# Patient Record
Sex: Female | Born: 1985
Health system: Southern US, Community
[De-identification: ages and names within clinical notes are randomized; demographics above are authoritative.]

## PROBLEM LIST (undated history)

## (undated) DIAGNOSIS — Z23 Encounter for immunization: Secondary | ICD-10-CM

## (undated) DIAGNOSIS — J45909 Unspecified asthma, uncomplicated: Secondary | ICD-10-CM

## (undated) DIAGNOSIS — K219 Gastro-esophageal reflux disease without esophagitis: Secondary | ICD-10-CM

## (undated) DIAGNOSIS — T7840XA Allergy, unspecified, initial encounter: Secondary | ICD-10-CM

## (undated) DIAGNOSIS — S0300XA Dislocation of jaw, unspecified side, initial encounter: Secondary | ICD-10-CM

## (undated) DIAGNOSIS — B009 Herpesviral infection, unspecified: Secondary | ICD-10-CM

## (undated) HISTORY — DX: Encounter for immunization: Z23

## (undated) HISTORY — PX: HERNIA REPAIR: SHX51

## (undated) HISTORY — PX: TONSILLECTOMY: SUR1361

## (undated) HISTORY — PX: MOLE REMOVAL: SHX2046

---
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
## 2007-10-17 ENCOUNTER — Ambulatory Visit: Payer: Self-pay | Admitting: Internal Medicine

## 2007-11-15 ENCOUNTER — Ambulatory Visit: Payer: Self-pay | Admitting: Family Medicine

## 2008-03-27 ENCOUNTER — Emergency Department: Payer: Self-pay | Admitting: Emergency Medicine

## 2009-09-25 ENCOUNTER — Emergency Department: Payer: Self-pay | Admitting: Emergency Medicine

## 2009-12-04 ENCOUNTER — Emergency Department: Payer: Self-pay | Admitting: Emergency Medicine

## 2010-07-04 ENCOUNTER — Ambulatory Visit: Payer: Self-pay | Admitting: Family Medicine

## 2010-11-09 ENCOUNTER — Ambulatory Visit: Payer: Self-pay | Admitting: Internal Medicine

## 2013-02-17 ENCOUNTER — Ambulatory Visit: Payer: Self-pay | Admitting: Internal Medicine

## 2013-12-16 ENCOUNTER — Ambulatory Visit: Payer: Self-pay

## 2013-12-16 LAB — RAPID STREP-A WITH REFLX: Micro Text Report: NEGATIVE

## 2013-12-18 LAB — BETA STREP CULTURE(ARMC)

## 2015-05-10 DIAGNOSIS — J45909 Unspecified asthma, uncomplicated: Secondary | ICD-10-CM | POA: Insufficient documentation

## 2015-05-10 DIAGNOSIS — J309 Allergic rhinitis, unspecified: Secondary | ICD-10-CM | POA: Insufficient documentation

## 2015-05-10 DIAGNOSIS — K219 Gastro-esophageal reflux disease without esophagitis: Secondary | ICD-10-CM | POA: Insufficient documentation

## 2015-05-14 ENCOUNTER — Encounter: Payer: Self-pay | Admitting: Gynecology

## 2015-05-14 ENCOUNTER — Ambulatory Visit
Admission: EM | Admit: 2015-05-14 | Discharge: 2015-05-14 | Disposition: A | Discharge status: Home / Self Care | Payer: BLUE CROSS/BLUE SHIELD | Attending: Internal Medicine | Admitting: Internal Medicine

## 2015-05-14 DIAGNOSIS — M5432 Sciatica, left side: Secondary | ICD-10-CM

## 2015-05-14 HISTORY — DX: Allergy, unspecified, initial encounter: T78.40XA

## 2015-05-14 HISTORY — DX: Herpesviral infection, unspecified: B00.9

## 2015-05-14 HISTORY — DX: Gastro-esophageal reflux disease without esophagitis: K21.9

## 2015-05-14 HISTORY — DX: Unspecified asthma, uncomplicated: J45.909

## 2015-05-14 NOTE — Discharge Instructions (Signed)
Sciatica Sciatica is pain, weakness, numbness, or tingling along the path of the sciatic nerve. The nerve starts in the lower back and runs down the back of each leg. The nerve controls the muscles in the lower leg and in the back of the knee, while also providing sensation to the back of the thigh, lower leg, and the sole of your foot. Sciatica is a symptom of another medical condition. For instance, nerve damage or certain conditions, such as a herniated disk or bone spur on the spine, pinch or put pressure on the sciatic nerve. This causes the pain, weakness, or other sensations normally associated with sciatica. Generally, sciatica only affects one side of the body. CAUSES   Herniated or slipped disc.  Degenerative disk disease.  A pain disorder involving the narrow muscle in the buttocks (piriformis syndrome).  Pelvic injury or fracture.  Pregnancy.  Tumor (rare). SYMPTOMS  Symptoms can vary from mild to very severe. The symptoms usually travel from the low back to the buttocks and down the back of the leg. Symptoms can include:  Mild tingling or dull aches in the lower back, leg, or hip.  Numbness in the back of the calf or sole of the foot.  Burning sensations in the lower back, leg, or hip.  Sharp pains in the lower back, leg, or hip.  Leg weakness.  Severe back pain inhibiting movement. These symptoms may get worse with coughing, sneezing, laughing, or prolonged sitting or standing. Also, being overweight may worsen symptoms. DIAGNOSIS  Your caregiver will perform a physical exam to look for common symptoms of sciatica. He or she may ask you to do certain movements or activities that would trigger sciatic nerve pain. Other tests may be performed to find the cause of the sciatica. These may include:  Blood tests.  X-rays.  Imaging tests, such as an MRI or CT scan. TREATMENT  Treatment is directed at the cause of the sciatic pain. Sometimes, treatment is not necessary  and the pain and discomfort goes away on its own. If treatment is needed, your caregiver may suggest:  Over-the-counter medicines to relieve pain.  Prescription medicines, such as anti-inflammatory medicine, muscle relaxants, or narcotics.  Applying heat or ice to the painful area.  Steroid injections to lessen pain, irritation, and inflammation around the nerve.  Reducing activity during periods of pain.  Exercising and stretching to strengthen your abdomen and improve flexibility of your spine. Your caregiver may suggest losing weight if the extra weight makes the back pain worse.  Physical therapy.  Surgery to eliminate what is pressing or pinching the nerve, such as a bone spur or part of a herniated disk. HOME CARE INSTRUCTIONS   Only take over-the-counter or prescription medicines for pain or discomfort as directed by your caregiver.  Apply ice to the affected area for 20 minutes, 3-4 times a day for the first 48-72 hours. Then try heat in the same way.  Exercise, stretch, or perform your usual activities if these do not aggravate your pain.  Attend physical therapy sessions as directed by your caregiver.  Keep all follow-up appointments as directed by your caregiver.  Do not wear high heels or shoes that do not provide proper support.  Check your mattress to see if it is too soft. A firm mattress may lessen your pain and discomfort. SEEK IMMEDIATE MEDICAL CARE IF:   You lose control of your bowel or bladder (incontinence).  You have increasing weakness in the lower back, pelvis, buttocks,   or legs.  You have redness or swelling of your back.  You have a burning sensation when you urinate.  You have pain that gets worse when you lie down or awakens you at night.  Your pain is worse than you have experienced in the past.  Your pain is lasting longer than 4 weeks.  You are suddenly losing weight without reason. MAKE SURE YOU:  Understand these  instructions.  Will watch your condition.  Will get help right away if you are not doing well or get worse. Document Released: 10/08/2001 Document Revised: 04/14/2012 Document Reviewed: 02/23/2012 ExitCare Patient Information 2015 [employer]. This information is not intended to replace advice given to you by your health care provider. Make sure you discuss any questions you have with your health care provider.  

## 2015-05-14 NOTE — ED Notes (Signed)
Patient c/o left hip pain x 4 days. Pt. Stated deep dull pain per pt. Question bursitis. Patient stated uncomfortable.

## 2015-05-15 ENCOUNTER — Encounter: Payer: Self-pay | Admitting: Physician Assistant

## 2015-05-15 NOTE — ED Provider Notes (Signed)
CSN: [id number]     Arrival date & time 05/14/15  0912 History   First MD Initiated Contact with Patient 05/14/15 [phone]     Chief Complaint  Patient presents with  . Hip Pain   (Consider location/radiation/quality/duration/timing/severity/associated sxs/prior Treatment) HPI 29 yo F with chronic recurrent low back discomfort -she describes as hip but also low left lateral back. Aggravated by rotational activity and repetition, intermittently recurrs. Not described as severe. No neurosensory changes reported. Void/defecate without difficulty.  Past Medical History  Diagnosis Date  . GERD (gastroesophageal reflux disease)   . Allergic   . Asthma   . HSV (herpes simplex virus) infection    Past Surgical History  Procedure Laterality Date  . Tonsillectomy    . Hernia repair    . Mole removal     Family History  Problem Relation Age of Onset  . GER disease Mother   . Irritable bowel syndrome Mother    History  Substance Use Topics  . Smoking status: Never Smoker   . Smokeless tobacco: Not on file  . Alcohol Use: Yes     Comment: occassion   OB History    No data available     Review of Systems Review of 10 systems negative for acute change except as referenced in HPI. Has asthma, GERD and seasonal allergies Allergies  Sulfa antibiotics  Home Medications   Prior to Admission medications   Medication Sig Start Date End Date Taking? Authorizing Provider  fluticasone (FLONASE) 50 MCG/ACT nasal spray Place into both nostrils daily.   Yes Historical Provider, MD  montelukast (SINGULAIR) 10 MG tablet Take 10 mg by mouth at bedtime.   Yes Historical Provider, MD  norgestimate-ethinyl estradiol (ORTHO-CYCLEN,SPRINTEC,PREVIFEM) 0.25-35 MG-MCG tablet Take 1 tablet by mouth daily.   Yes Historical Provider, MD  valACYclovir (VALTREX) 500 MG tablet Take 500 mg by mouth 2 (two) times daily.   Yes Historical Provider, MD   BP 107/70 mmHg  Pulse 69  Temp(Src) 98 F (36.7 C)  (Tympanic)  Ht 5\' 3"  (1.6 m)  Wt 139 lb (63.05 kg)  BMI 24.63 kg/m2  SpO2 98%  LMP 05/09/2015 Physical Exam   Constitutional -alert and oriented,well appearing and in no acute distress Head-atraumatic, normocephalic Eyes- conjunctiva normal, EOMI ,conjugate gaze Nose- no congestion or rhinorrhea Mouth/throat- mucous membranes moist , s/p tonsillectomy Neck- supple CV- regular rate, grossly normal heart sounds,  Resp-no distress, normal respiratory effort,clear to auscultation bilaterally Back- no spinous or para-spinous tenderness to palpation; minimal tenderness left sciatic, negative right. SLR is neg bilaterally. Supine to seated and reverse without assistance/difficulty. DTRs equal bilat, no sensory changes GI- soft,non-tender,no distention GU-  not examined MSK- no tender, normal ROM, all extremities, ambulatory, self-care Neuro- normal speech and language, no gross focal neurological deficit appreciated, no gait instability, Skin-warm,dry ,intact; no rash noted Psych-mood and affect grossly normal; speech and behavior grossly normal ED Course  Procedures (including critical care time) Labs Review Labs Reviewed - No data to display  Imaging Review No results found.  Discussed aggravators and interventions. Might benefit from toning and exercise perhaps a PT referral for recommendations. Her GERD is apparently quite significant and easily aggravated so we move cautiously with NSAIDs and only for acute intervention when needed. Heat /Ice/frozen peas. Body mechanics. Exercises for low back strengthening and sacroilliac pain were reviewed and sent home. Encourage use of athletic rubs, heat patches for comfort-but encourage proactive understanding to avoid aggravating the area.   MDM   1.  Sciatica, left    Plan: 1. Diagnosis reviewed with patient 2. Rx as per orders; risks, benefits, potential side effects reviewed with patient 3. Recommend supportive treatment as noted  above 4. F/u prn if symptoms worsen or don't improve-      Jan Fireman, PA-C 05/15/15 [phone]

## 2016-08-13 ENCOUNTER — Ambulatory Visit
Admission: EM | Admit: 2016-08-13 | Discharge: 2016-08-13 | Disposition: A | Discharge status: Home / Self Care | Payer: BLUE CROSS/BLUE SHIELD | Attending: Family Medicine | Admitting: Family Medicine

## 2016-08-13 DIAGNOSIS — S29012A Strain of muscle and tendon of back wall of thorax, initial encounter: Secondary | ICD-10-CM | POA: Diagnosis not present

## 2016-08-13 LAB — HM PAP SMEAR: HM PAP: NEGATIVE

## 2016-08-13 MED ORDER — HYDROCODONE-ACETAMINOPHEN 5-325 MG PO TABS: 1.0000 | ORAL_TABLET | Freq: Four times a day (QID) | ORAL | 0 refills | Status: DC | PRN

## 2016-08-13 MED ORDER — CYCLOBENZAPRINE HCL 10 MG PO TABS
10.0000 mg | ORAL_TABLET | Freq: Every day | ORAL | 0 refills | Status: DC
Start: 2016-08-13 — End: 2017-08-01

## 2016-08-13 NOTE — ED Triage Notes (Signed)
Patient complains of shoulder pain that started last week after squatting with weight. Patient states that she has been having pain that radiates down and up her arm. Patient states that she has tried ice with little relief.

## 2016-08-13 NOTE — ED Provider Notes (Signed)
[facility]    CSN: [id number] Arrival date & time: 08/13/16  1128     History   Chief Complaint Chief Complaint  Patient presents with  . Shoulder Pain    HPI Erika Moore is a 30 y.o. female.   30 yo female with a c/o right shoulder pain that started last week after squatting with weight. Patient states that she has been having pain that radiates down and up her arm. Patient states that she has tried ice with little relief. Denies any falls, injury, direct trauma, numbness/tingling, rash, fevers, chills.    The history is provided by the patient.    Past Medical History:  Diagnosis Date  . Allergic   . Asthma   . GERD (gastroesophageal reflux disease)   . HSV (herpes simplex virus) infection     There are no active problems to display for this patient.   Past Surgical History:  Procedure Laterality Date  . HERNIA REPAIR    . MOLE REMOVAL    . TONSILLECTOMY      OB History    No data available       Home Medications    Prior to Admission medications   Medication Sig Start Date End Date Taking? Authorizing Provider  montelukast (SINGULAIR) 10 MG tablet Take 10 mg by mouth at bedtime.   Yes Historical Provider, MD  norgestimate-ethinyl estradiol (ORTHO-CYCLEN,SPRINTEC,PREVIFEM) 0.25-35 MG-MCG tablet Take 1 tablet by mouth daily.   Yes Historical Provider, MD  valACYclovir (VALTREX) 500 MG tablet Take 500 mg by mouth 2 (two) times daily.   Yes Historical Provider, MD  cyclobenzaprine (FLEXERIL) 10 MG tablet Take 1 tablet (10 mg total) by mouth at bedtime. 08/13/16   Norval Gable, MD  fluticasone (FLONASE) 50 MCG/ACT nasal spray Place into both nostrils daily.    Historical Provider, MD  HYDROcodone-acetaminophen (NORCO/VICODIN) 5-325 MG tablet Take 1-2 tablets by mouth every 6 (six) hours as needed. 08/13/16   Norval Gable, MD    Family History Family History  Problem Relation Age of Onset  . GER disease Mother   . Irritable bowel  syndrome Mother     Social History Social History  Substance Use Topics  . Smoking status: Never Smoker  . Smokeless tobacco: Never Used  . Alcohol use Yes     Comment: occasional     Allergies   Sulfa antibiotics   Review of Systems Review of Systems   Physical Exam Triage Vital Signs ED Triage Vitals  Enc Vitals Group     BP 08/13/16 1151 107/70     Pulse Rate 08/13/16 1151 64     Resp 08/13/16 1151 16     Temp 08/13/16 1151 98 F (36.7 C)     Temp Source 08/13/16 1151 Oral     SpO2 08/13/16 1151 98 %     Weight 08/13/16 1152 152 lb (68.9 kg)     Height 08/13/16 1152 5\' 3"  (1.6 m)     Head Circumference --      Peak Flow --      Pain Score 08/13/16 1153 4     Pain Loc --      Pain Edu? --      Excl. in [place]? --    No data found.   Updated Vital Signs BP 107/70 (BP Location: Left Arm)   Pulse 64   Temp 98 F (36.7 C) (Oral)   Resp 16   Ht 5\' 3"  (1.6 m)   Wt  152 lb (68.9 kg)   LMP 07/30/2016   SpO2 98%   BMI 26.93 kg/m   Visual Acuity Right Eye Distance:   Left Eye Distance:   Bilateral Distance:    Right Eye Near:   Left Eye Near:    Bilateral Near:     Physical Exam  Constitutional: She appears well-developed and well-nourished. No distress.  Musculoskeletal:       Right shoulder: She exhibits normal range of motion, no tenderness, no bony tenderness, no swelling, no effusion, no crepitus, no deformity, no laceration, no pain, no spasm, normal pulse and normal strength.       Cervical back: She exhibits tenderness (over trapezius muscle) and spasm (over trapezius muscle).       Back:  Skin: She is not diaphoretic.  Nursing note and vitals reviewed.    UC Treatments / Results  Labs (all labs ordered are listed, but only abnormal results are displayed) Labs Reviewed - No data to display  EKG  EKG Interpretation None       Radiology No results found.  Procedures Procedures (including critical care time)  Medications Ordered  in UC Medications - No data to display   Initial Impression / Assessment and Plan / UC Course  I have reviewed the triage vital signs and the nursing notes.  Pertinent labs & imaging results that were available during my care of the patient were reviewed by me and considered in my medical decision making (see chart for details).  Clinical Course      Final Clinical Impressions(s) / UC Diagnoses   Final diagnoses:  Upper back strain, initial encounter    New Prescriptions New Prescriptions   CYCLOBENZAPRINE (FLEXERIL) 10 MG TABLET    Take 1 tablet (10 mg total) by mouth at bedtime.   HYDROCODONE-ACETAMINOPHEN (NORCO/VICODIN) 5-325 MG TABLET    Take 1-2 tablets by mouth every 6 (six) hours as needed.   1. diagnosis reviewed with patient 2. rx as per orders above; reviewed possible side effects, interactions, risks and benefits  3. Recommend supportive treatment with warm compressses; otc analgesics/NSAIDS prn 4. Follow-up prn if symptoms worsen or don't improve   Norval Gable, MD 08/13/16 1246

## 2016-08-16 ENCOUNTER — Telehealth: Payer: Self-pay | Admitting: *Deleted

## 2016-08-16 NOTE — Telephone Encounter (Signed)
Courtesy call back, verified DOB, patient reports no improvement. Advised patient to follow up with PCP or [facility] if symptoms do not resolved or become worse.

## 2017-08-01 ENCOUNTER — Other Ambulatory Visit: Payer: Self-pay

## 2017-08-01 ENCOUNTER — Encounter (INDEPENDENT_AMBULATORY_CARE_PROVIDER_SITE_OTHER): Payer: Self-pay

## 2017-08-01 ENCOUNTER — Ambulatory Visit (INDEPENDENT_AMBULATORY_CARE_PROVIDER_SITE_OTHER): Payer: 59 | Admitting: Gastroenterology

## 2017-08-01 ENCOUNTER — Encounter: Payer: Self-pay | Admitting: Gastroenterology

## 2017-08-01 VITALS — BP 121/77 | HR 64 | Temp 98.4°F | Ht 63.0 in | Wt 140.0 lb

## 2017-08-01 DIAGNOSIS — T7840XA Allergy, unspecified, initial encounter: Secondary | ICD-10-CM | POA: Insufficient documentation

## 2017-08-01 DIAGNOSIS — K219 Gastro-esophageal reflux disease without esophagitis: Secondary | ICD-10-CM

## 2017-08-01 DIAGNOSIS — B009 Herpesviral infection, unspecified: Secondary | ICD-10-CM | POA: Insufficient documentation
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
## 2017-08-18 ENCOUNTER — Encounter: Payer: Self-pay | Admitting: Obstetrics and Gynecology

## 2017-08-18 ENCOUNTER — Ambulatory Visit (INDEPENDENT_AMBULATORY_CARE_PROVIDER_SITE_OTHER): Payer: 59 | Admitting: Obstetrics and Gynecology

## 2017-08-18 DIAGNOSIS — Z124 Encounter for screening for malignant neoplasm of cervix: Secondary | ICD-10-CM | POA: Diagnosis not present

## 2017-08-18 DIAGNOSIS — Z01419 Encounter for gynecological examination (general) (routine) without abnormal findings: Secondary | ICD-10-CM

## 2017-08-18 MED ORDER — TRI-LO-SPRINTEC 0.18/0.215/0.25 MG-25 MCG PO TABS: 1.0000 | ORAL_TABLET | Freq: Every day | ORAL | 3 refills | Status: DC

## 2017-08-18 MED ORDER — VALACYCLOVIR HCL 500 MG PO TABS: 500.0000 mg | ORAL_TABLET | Freq: Two times a day (BID) | ORAL | 6 refills | Status: AC

## 2017-08-18 NOTE — Progress Notes (Signed)
Gynecology Annual Exam  PCP: [facility]  Chief Complaint:  Chief Complaint  Patient presents with  . Gynecologic Exam    History of Present Illness: Patient is a 31 y.o. No obstetric history on file. presents for annual exam. The patient has no complaints today.   LMP: Patient's last menstrual period was 08/01/2017 (exact date). Average Interval: regular, 28 days Duration of flow: 5 days Heavy Menses: no Clots: no Intermenstrual Bleeding: no Postcoital Bleeding: no Dysmenorrhea: no  The patient is sexually active. She currently uses OCP (estrogen/progesterone) for contraception. She denies dyspareunia.  The patient does perform self breast exams.  There is no notable family history of breast or ovarian cancer in her family.  The patient wears seatbelts: yes.   The patient has regular exercise: not asked.    The patient denies current symptoms of depression.    Review of Systems: Review of Systems  Constitutional: Negative for chills and fever.  HENT: Negative for congestion.   Respiratory: Negative for cough and shortness of breath.   Cardiovascular: Negative for chest pain and palpitations.  Gastrointestinal: Negative for abdominal pain, constipation, diarrhea, heartburn, nausea and vomiting.  Genitourinary: Negative for dysuria, frequency and urgency.  Skin: Negative for itching and rash.  Neurological: Negative for dizziness and headaches.  Endo/Heme/Allergies: Negative for polydipsia.  Psychiatric/Behavioral: Negative for depression.    Past Medical History:  Past Medical History:  Diagnosis Date  . Allergic   . Asthma   . GERD (gastroesophageal reflux disease)   . HSV (herpes simplex virus) infection     Past Surgical History:  Past Surgical History:  Procedure Laterality Date  . HERNIA REPAIR    . MOLE REMOVAL    . TONSILLECTOMY      Gynecologic History:  Patient's last menstrual period was 08/01/2017 (exact  date). Contraception: OCP (estrogen/progesterone) Last Pap: Results were: 08/13/2016 no abnormalities   Obstetric History: No obstetric history on file.  Family History:  Family History  Problem Relation Age of Onset  . GER disease Mother   . Irritable bowel syndrome Mother   . Hypertension Mother   . Pancreatic cancer Maternal Grandmother 41  . Ovarian cancer Paternal Grandmother 69  . Leukemia Paternal Grandfather   . Breast cancer Other 31       Genetic test negative    Social History:  Social History   Social History  . Marital status: Single    Spouse name: N/A  . Number of children: N/A  . Years of education: N/A   Occupational History  . Not on file.   Social History Main Topics  . Smoking status: Never Smoker  . Smokeless tobacco: Never Used  . Alcohol use Yes     Comment: occasional  . Drug use: No  . Sexual activity: Yes    Birth control/ protection: Pill   Other Topics Concern  . Not on file   Social History Narrative  . No narrative on file    Allergies:  Allergies  Allergen Reactions  . Sulfa Antibiotics Hives    Medications: Prior to Admission medications   Medication Sig Start Date End Date Taking? Authorizing Provider  fluticasone (FLONASE) 50 MCG/ACT nasal spray Place into both nostrils daily.    [provider]  omeprazole (PRILOSEC) 40 MG capsule  06/03/17   [provider]  pantoprazole (PROTONIX) 40 MG tablet Take by mouth. 07/14/17 07/14/18  [provider]  TRI-LO-SPRINTEC 0.18/0.215/0.25 MG-25 MCG tab  06/09/17  [provider]    Physical Exam Vitals: Blood pressure 110/70, pulse 71, height 5\' 3"  (1.6 m), weight 147 lb (66.7 kg), last menstrual period 08/01/2017.  General: NAD HEENT: normocephalic, anicteric Thyroid: no enlargement, no palpable nodules Pulmonary: No increased work of breathing, CTAB Cardiovascular: RRR, distal pulses 2+ Breast: Breast symmetrical, no tenderness, no palpable  nodules or masses, no skin or nipple retraction present, no nipple discharge.  No axillary or supraclavicular lymphadenopathy. Abdomen: NABS, soft, non-tender, non-distended.  Umbilicus without lesions.  No hepatomegaly, splenomegaly or masses palpable. No evidence of hernia  Genitourinary:  External: Normal external female genitalia.  Normal urethral meatus, normal  Bartholin's and Skene's glands.    Vagina: Normal vaginal mucosa, no evidence of prolapse.    Cervix: Grossly normal in appearance, no bleeding  Uterus: Non-enlarged, mobile, normal contour.  No CMT  Adnexa: ovaries non-enlarged, no adnexal masses  Rectal: deferred  Lymphatic: no evidence of inguinal lymphadenopathy Extremities: no edema, erythema, or tenderness Neurologic: Grossly intact Psychiatric: mood appropriate, affect full  Female chaperone present for pelvic and breast  portions of the physical exam    Assessment: 31 y.o. routine annual exam  Plan: Problem List Items Addressed This Visit    None    Visit Diagnoses    Screening for malignant neoplasm of cervix       Relevant Orders   Pap IG w/ reflex to HPV when [facility]-U   Encounter for gynecological examination without abnormal finding          1) STI screening was offered and declined  2) ASCCP guidelines and rational discussed.  Patient opts for yearly screening interval - previously completed gardasil series  3) Contraception - Education given regarding options for contraception, continue OCP  4) Routine healthcare maintenance including cholesterol, diabetes screening discussed managed by PCP  5) Follow up 1 year for routine annual exam

## 2017-08-19 ENCOUNTER — Encounter: Payer: Self-pay | Admitting: Obstetrics and Gynecology

## 2017-08-19 LAB — PAP IG W/ RFLX HPV ASCU: PAP SMEAR COMMENT: 0

## 2017-08-22 ENCOUNTER — Encounter: Payer: Self-pay | Admitting: Anesthesiology

## 2017-08-28 NOTE — Discharge Instructions (Signed)
General Anesthesia, Adult, Care After °These instructions provide you with information about caring for yourself after your procedure. Your health care provider may also give you more specific instructions. Your treatment has been planned according to current medical practices, but problems sometimes occur. Call your health care provider if you have any problems or questions after your procedure. °What can I expect after the procedure? °After the procedure, it is common to have: °· Vomiting. °· A sore throat. °· Mental slowness. ° °It is common to feel: °· Nauseous. °· Cold or shivery. °· Sleepy. °· Tired. °· Sore or achy, even in parts of your body where you did not have surgery. ° °Follow these instructions at home: °For at least 24 hours after the procedure: °· Do not: °? Participate in activities where you could fall or become injured. °? Drive. °? Use heavy machinery. °? Drink alcohol. °? Take sleeping pills or medicines that cause drowsiness. °? Make important decisions or sign legal documents. °? Take care of children on your own. °· Rest. °Eating and drinking °· If you vomit, drink water, juice, or soup when you can drink without vomiting. °· Drink enough fluid to keep your urine clear or pale yellow. °· Make sure you have little or no nausea before eating solid foods. °· Follow the diet recommended by your health care provider. °General instructions °· Have a responsible adult stay with you until you are awake and alert. °· Return to your normal activities as told by your health care provider. Ask your health care provider what activities are safe for you. °· Take over-the-counter and prescription medicines only as told by your health care provider. °· If you smoke, do not smoke without supervision. °· Keep all follow-up visits as told by your health care provider. This is important. °Contact a health care provider if: °· You continue to have nausea or vomiting at home, and medicines are not helpful. °· You  cannot drink fluids or start eating again. °· You cannot urinate after 8-12 hours. °· You develop a skin rash. °· You have fever. °· You have increasing redness at the site of your procedure. °Get help right away if: °· You have difficulty breathing. °· You have chest pain. °· You have unexpected bleeding. °· You feel that you are having a life-threatening or urgent problem. °This information is not intended to replace advice given to you by your health care provider. Make sure you discuss any questions you have with your health care provider. °Document Released: 01/20/2001 Document Revised: 03/18/2016 Document Reviewed: 09/28/2015 °Elsevier Interactive Patient Education © 2018 [employer] ° °

## 2017-09-03 ENCOUNTER — Ambulatory Visit: Payer: Commercial Managed Care - HMO | Admitting: Anesthesiology

## 2017-09-03 ENCOUNTER — Ambulatory Visit
Admission: RE | Admit: 2017-09-03 | Discharge: 2017-09-03 | Disposition: A | Discharge status: Home / Self Care | Payer: Commercial Managed Care - HMO | Source: Ambulatory Visit | Attending: Gastroenterology | Admitting: Gastroenterology

## 2017-09-03 ENCOUNTER — Encounter
Admission: RE | Disposition: A | Discharge status: Home / Self Care | Payer: Self-pay | Source: Ambulatory Visit | Attending: Gastroenterology

## 2017-09-03 DIAGNOSIS — Z803 Family history of malignant neoplasm of breast: Secondary | ICD-10-CM | POA: Insufficient documentation

## 2017-09-03 DIAGNOSIS — Z8041 Family history of malignant neoplasm of ovary: Secondary | ICD-10-CM | POA: Diagnosis not present

## 2017-09-03 DIAGNOSIS — Z806 Family history of leukemia: Secondary | ICD-10-CM | POA: Insufficient documentation

## 2017-09-03 DIAGNOSIS — J45909 Unspecified asthma, uncomplicated: Secondary | ICD-10-CM | POA: Diagnosis not present

## 2017-09-03 DIAGNOSIS — Z8379 Family history of other diseases of the digestive system: Secondary | ICD-10-CM | POA: Insufficient documentation

## 2017-09-03 DIAGNOSIS — Z800 Family history of malignant neoplasm of digestive organs: Secondary | ICD-10-CM | POA: Insufficient documentation

## 2017-09-03 DIAGNOSIS — Z79899 Other long term (current) drug therapy: Secondary | ICD-10-CM | POA: Insufficient documentation

## 2017-09-03 DIAGNOSIS — Z8249 Family history of ischemic heart disease and other diseases of the circulatory system: Secondary | ICD-10-CM | POA: Diagnosis not present

## 2017-09-03 DIAGNOSIS — Z882 Allergy status to sulfonamides status: Secondary | ICD-10-CM | POA: Insufficient documentation

## 2017-09-03 DIAGNOSIS — K219 Gastro-esophageal reflux disease without esophagitis: Secondary | ICD-10-CM | POA: Insufficient documentation

## 2017-09-03 HISTORY — PX: ESOPHAGOGASTRODUODENOSCOPY (EGD) WITH PROPOFOL: SHX5813

## 2017-09-03 SURGERY — ESOPHAGOGASTRODUODENOSCOPY (EGD) WITH PROPOFOL
Anesthesia: General | Wound class: Clean Contaminated

## 2017-09-03 MED ORDER — LACTATED RINGERS IV SOLN
1000.0000 mL | INTRAVENOUS | Status: DC
  Administered 2017-09-03: 1000 mL via INTRAVENOUS

## 2017-09-03 MED ORDER — SODIUM CHLORIDE 0.9 % IV SOLN: INTRAVENOUS | Status: DC

## 2017-09-03 MED ORDER — GLYCOPYRROLATE 0.2 MG/ML IJ SOLN
INTRAMUSCULAR | Status: DC | PRN
  Administered 2017-09-03: 0.1 mg via INTRAVENOUS

## 2017-09-03 MED ORDER — PROPOFOL 10 MG/ML IV BOLUS
INTRAVENOUS | Status: DC | PRN
  Administered 2017-09-03: 150 mg via INTRAVENOUS
  Administered 2017-09-03: 20 mg via INTRAVENOUS
  Administered 2017-09-03: 30 mg via INTRAVENOUS
  Administered 2017-09-03: 20 mg via INTRAVENOUS

## 2017-09-03 MED ORDER — LIDOCAINE HCL (CARDIAC) 20 MG/ML IV SOLN
INTRAVENOUS | Status: DC | PRN
  Administered 2017-09-03: 50 mg via INTRAVENOUS

## 2017-09-03 SURGICAL SUPPLY — 32 items
BALLN DILATOR 10-12 8 (BALLOONS)
BALLN DILATOR 12-15 8 (BALLOONS)
BALLN DILATOR 15-18 8 (BALLOONS)
BALLN DILATOR CRE 0-12 8 (BALLOONS)
BALLN DILATOR ESOPH 8 10 CRE (MISCELLANEOUS) IMPLANT
BALLOON DILATOR 12-15 8 (BALLOONS) IMPLANT
BALLOON DILATOR 15-18 8 (BALLOONS) IMPLANT
BALLOON DILATOR CRE 0-12 8 (BALLOONS) IMPLANT
BLOCK BITE 60FR ADLT L/F GRN (MISCELLANEOUS) ×3 IMPLANT
CANISTER SUCT 1200ML W/VALVE (MISCELLANEOUS) ×3 IMPLANT
CLIP HMST 235XBRD CATH ROT (MISCELLANEOUS) IMPLANT
CLIP RESOLUTION 360 11X235 (MISCELLANEOUS)
FCP ESCP3.2XJMB 240X2.8X (MISCELLANEOUS) ×1
FORCEPS BIOP RAD 4 LRG CAP 4 (CUTTING FORCEPS) IMPLANT
FORCEPS BIOP RJ4 240 W/NDL (MISCELLANEOUS) ×2
FORCEPS ESCP3.2XJMB 240X2.8X (MISCELLANEOUS) ×1 IMPLANT
GOWN CVR UNV OPN BCK APRN NK (MISCELLANEOUS) ×2 IMPLANT
GOWN ISOL THUMB LOOP REG UNIV (MISCELLANEOUS) ×4
INJECTOR VARIJECT VIN23 (MISCELLANEOUS) IMPLANT
KIT DEFENDO VALVE AND CONN (KITS) IMPLANT
KIT ENDO PROCEDURE OLY (KITS) ×3 IMPLANT
MARKER SPOT ENDO TATTOO 5ML (MISCELLANEOUS) IMPLANT
PAD GROUND ADULT SPLIT (MISCELLANEOUS) IMPLANT
RETRIEVER NET PLAT FOOD (MISCELLANEOUS) IMPLANT
SNARE SHORT THROW 13M SML OVAL (MISCELLANEOUS) IMPLANT
SNARE SHORT THROW 30M LRG OVAL (MISCELLANEOUS) IMPLANT
SPOT EX ENDOSCOPIC TATTOO (MISCELLANEOUS)
SYR INFLATION 60ML (SYRINGE) IMPLANT
TRAP ETRAP POLY (MISCELLANEOUS) IMPLANT
VARIJECT INJECTOR VIN23 (MISCELLANEOUS)
WATER STERILE IRR 250ML POUR (IV SOLUTION) ×3 IMPLANT
WIRE CRE 18-20MM 8CM F G (MISCELLANEOUS) IMPLANT

## 2017-09-03 NOTE — Transfer of Care (Signed)
Immediate Anesthesia Transfer of Care Note  Patient: Erika Moore  Procedure(s) Performed: ESOPHAGOGASTRODUODENOSCOPY (EGD) WITH PROPOFOL (N/A )  Patient Location: PACU  Anesthesia Type: General  Level of Consciousness: awake, alert  and patient cooperative  Airway and Oxygen Therapy: Patient Spontanous Breathing and Patient connected to supplemental oxygen  Post-op Assessment: Post-op Vital signs reviewed, Patient's Cardiovascular Status Stable, Respiratory Function Stable, Patent Airway and No signs of Nausea or vomiting  Post-op Vital Signs: Reviewed and stable  Complications: No apparent anesthesia complications

## 2017-09-03 NOTE — Anesthesia Procedure Notes (Signed)
Date/Time: 09/03/2017 8:36 AM Performed by: Cameron Ali, CRNA Pre-anesthesia Checklist: Patient identified, Emergency Drugs available, Suction available, Timeout performed and Patient being monitored Patient Re-evaluated:Patient Re-evaluated prior to induction Oxygen Delivery Method: Nasal cannula Placement Confirmation: positive ETCO2

## 2017-09-03 NOTE — Op Note (Signed)
[facility] Patient Name: Erika Moore Procedure Date: 09/03/2017 8:28 AM MRN: [id number] Account #: [id number] Date of Birth: Feb 28, 1986 Admit Type: Outpatient Age: 31 Room: [facility] OR ROOM 01 Gender: Female Note Status: Finalized Procedure:            Upper GI endoscopy Indications:          Heartburn, Suspected reflux esophagitis, Suspected                        gastro-esophageal reflux disease Providers:            Lin Landsman MD, MD Referring MD:         Kerin Perna MD, MD (Referring MD) Medicines:            Monitored Anesthesia Care Complications:        No immediate complications. Estimated blood loss: None. Procedure:            Pre-Anesthesia Assessment:                       - Prior to the procedure, a History and Physical was                        performed, and patient medications and allergies were                        reviewed. The patient is competent. The risks and                        benefits of the procedure and the sedation options and                        risks were discussed with the patient. All questions                        were answered and informed consent was obtained.                        Patient identification and proposed procedure were                        verified by the physician, the nurse, the                        anesthesiologist, the anesthetist and the technician in                        the pre-procedure area in the procedure room. Mental                        Status Examination: alert and oriented. Airway                        Examination: normal oropharyngeal airway and neck                        mobility. Respiratory Examination: clear to                        auscultation. CV Examination: normal. Prophylactic  Antibiotics: The patient does not require prophylactic                        antibiotics. Prior Anticoagulants: The patient has         taken no previous anticoagulant or antiplatelet agents.                        ASA Grade Assessment: II - A patient with mild systemic                        disease. After reviewing the risks and benefits, the                        patient was deemed in satisfactory condition to undergo                        the procedure. The anesthesia plan was to use monitored                        anesthesia care (MAC). Immediately prior to                        administration of medications, the patient was                        re-assessed for adequacy to receive sedatives. The                        heart rate, respiratory rate, oxygen saturations, blood                        pressure, adequacy of pulmonary ventilation, and                        response to care were monitored throughout the                        procedure. The physical status of the patient was                        re-assessed after the procedure.                       After obtaining informed consent, the endoscope was                        passed under direct vision. Throughout the procedure,                        the patient's blood pressure, pulse, and oxygen                        saturations were monitored continuously. The Olympus                        GIF H180J Endoscope (S#: [id number]) was introduced                        through the mouth, and advanced to the second part of  duodenum. The upper GI endoscopy was accomplished                        without difficulty. The patient tolerated the procedure                        well. Findings:      The duodenal bulb and second portion of the duodenum were normal.      The entire examined stomach was normal. Reflux of bile noticed into       stomach      The cardia and gastric fundus were normal on retroflexion.      Esophagogastric landmarks were identified: the Z-line was found at 36 cm       from the incisors.      The Z-line was  irregular and was found 36 cm from the incisors.      The examined esophagus was normal. Biopsies were obtained from the       proximal and distal esophagus with cold forceps for histology of       suspected eosinophilic esophagitis. Impression:           - Normal duodenal bulb and second portion of the                        duodenum.                       - Normal stomach. Bile reflux present                       - Esophagogastric landmarks identified.                       - Z-line irregular, 36 cm from the incisors.                       - Normal esophagus. Biopsied. Recommendation:       - Discharge patient to home.                       - Resume previous diet.                       - Continue present medications.                       - Await pathology results.                       - Return to my office as previously scheduled.                       - Use Nexium (esomeprazole) 40 mg PO daily. Procedure Code(s):    --- Professional ---                       [phone], Esophagogastroduodenoscopy, flexible, transoral;                        with biopsy, single or multiple Diagnosis Code(s):    --- Professional ---                       K22.8, Other specified diseases of esophagus  R12, Heartburn CPT copyright 2016 American Medical Association. All rights reserved. The codes documented in this report are preliminary and upon coder review may  be revised to meet current compliance requirements. Dr. Ulyess Mort Lin Landsman MD, MD 09/03/2017 8:51:54 AM This report has been signed electronically. Number of Addenda: 0 Note Initiated On: 09/03/2017 8:28 AM      [facility]

## 2017-09-03 NOTE — Anesthesia Postprocedure Evaluation (Signed)
Anesthesia Post Note  Patient: Erika Moore  Procedure(s) Performed: ESOPHAGOGASTRODUODENOSCOPY (EGD) WITH PROPOFOL (N/A )  Patient location during evaluation: PACU Anesthesia Type: General Level of consciousness: awake and alert Pain management: pain level controlled Vital Signs Assessment: post-procedure vital signs reviewed and stable Respiratory status: spontaneous breathing, nonlabored ventilation, respiratory function stable and patient connected to nasal cannula oxygen Cardiovascular status: blood pressure returned to baseline and stable Postop Assessment: no apparent nausea or vomiting Anesthetic complications: no    Alisa Graff

## 2017-09-03 NOTE — Anesthesia Preprocedure Evaluation (Signed)
Anesthesia Evaluation  Patient identified by MRN, date of birth, ID band Patient awake    Reviewed: Allergy & Precautions, H&P , NPO status , Patient's Chart, lab work & pertinent test results, reviewed documented beta blocker date and time   Airway Mallampati: II  TM Distance: >3 FB Neck ROM: full    Dental no notable dental hx.    Pulmonary asthma ,    Pulmonary exam normal breath sounds clear to auscultation       Cardiovascular Exercise Tolerance: Good negative cardio ROS   Rhythm:regular Rate:Normal     Neuro/Psych negative neurological ROS  negative psych ROS   GI/Hepatic Neg liver ROS, GERD  ,  Endo/Other  negative endocrine ROS  Renal/GU negative Renal ROS  negative genitourinary   Musculoskeletal   Abdominal   Peds  Hematology negative hematology ROS (+)   Anesthesia Other Findings   Reproductive/Obstetrics negative OB ROS                             Anesthesia Physical Anesthesia Plan  ASA: II  Anesthesia Plan: General   Post-op Pain Management:    Induction:   PONV Risk Score and Plan: 3  Airway Management Planned:   Additional Equipment:   Intra-op Plan:   Post-operative Plan:   Informed Consent: I have reviewed the patients History and Physical, chart, labs and discussed the procedure including the risks, benefits and alternatives for the proposed anesthesia with the patient or authorized representative who has indicated his/her understanding and acceptance.   Dental Advisory Given  Plan Discussed with: CRNA  Anesthesia Plan Comments:         Anesthesia Quick Evaluation

## 2017-09-03 NOTE — H&P (Signed)
Cephas Darby, MD [address]  [place]  [place], [place] [address]  Main: [phone]  Fax: [phone] Pager: [phone]  Primary Care Physician:  [facility] Primary Gastroenterologist:  Dr. Cephas Darby  Pre-Procedure History & Physical: HPI:  Erika Moore is a 31 y.o. female is here for an endoscopy.   Past Medical History:  Diagnosis Date  . Allergic   . Asthma   . GERD (gastroesophageal reflux disease)   . HSV (herpes simplex virus) infection     Past Surgical History:  Procedure Laterality Date  . HERNIA REPAIR    . MOLE REMOVAL    . TONSILLECTOMY      Prior to Admission medications   Medication Sig Start Date End Date Taking? Authorizing Provider  esomeprazole (NEXIUM) 20 MG capsule Take 20 mg by mouth daily at 12 noon.   Yes [provider]  fluticasone (FLONASE) 50 MCG/ACT nasal spray Place into both nostrils daily.   Yes [provider]  TRI-LO-SPRINTEC 0.18/0.215/0.25 MG-25 MCG tab Take 1 tablet by mouth daily. 08/18/17  Yes Malachy Mood, MD  omeprazole (PRILOSEC) 40 MG capsule  06/03/17   [provider]  pantoprazole (PROTONIX) 40 MG tablet Take by mouth. 07/14/17 07/14/18  [provider]    Allergies as of 08/01/2017 - Review Complete 08/01/2017  Allergen Reaction Noted  . Sulfa antibiotics Hives 05/10/2015    Family History  Problem Relation Age of Onset  . GER disease Mother   . Irritable bowel syndrome Mother   . Hypertension Mother   . Pancreatic cancer Maternal Grandmother 33  . Ovarian cancer Paternal Grandmother 43  . Leukemia Paternal Grandfather   . Breast cancer Other 37       Genetic test negative    Social History   Socioeconomic History  . Marital status: Single    Spouse name: Not on file  . Number of children: Not on file  . Years of education: Not on file  . Highest education level: Not on file  Social Needs  . Financial resource strain: Not on file   . Food insecurity - worry: Not on file  . Food insecurity - inability: Not on file  . Transportation needs - medical: Not on file  . Transportation needs - non-medical: Not on file  Occupational History  . Not on file  Tobacco Use  . Smoking status: Never Smoker  . Smokeless tobacco: Never Used  Substance and Sexual Activity  . Alcohol use: Yes    Comment: occasional  . Drug use: No  . Sexual activity: Yes    Birth control/protection: Pill  Other Topics Concern  . Not on file  Social History Narrative  . Not on file    Review of Systems: See HPI, otherwise negative ROS  Physical Exam: BP 115/67   Pulse 76   Temp 98.2 F (36.8 C) (Temporal)   Resp 16   Ht 5\' 3"  (1.6 m)   Wt 144 lb (65.3 kg)   SpO2 100%   BMI 25.51 kg/m  General:   Alert,  pleasant and cooperative in NAD Head:  Normocephalic and atraumatic. Neck:  Supple; no masses or thyromegaly. Lungs:  Clear throughout to auscultation.    Heart:  Regular rate and rhythm. Abdomen:  Soft, nontender and nondistended. Normal bowel sounds, without guarding, and without rebound.   Neurologic:  Alert and  oriented x4;  grossly normal neurologically.  Impression/Plan: Erika Moore is here for an  endoscopy to be performed for chronic GERD  Risks, benefits, limitations, and alternatives regarding  endoscopy have been reviewed with the patient.  Questions have been answered.  All parties agreeable.   Sherri Sear, MD  09/03/2017, 7:50 AM

## 2017-09-04 ENCOUNTER — Encounter: Payer: Self-pay | Admitting: Gastroenterology

## 2017-09-05 ENCOUNTER — Encounter: Payer: Self-pay | Admitting: Gastroenterology

## 2018-07-27 ENCOUNTER — Other Ambulatory Visit: Payer: Self-pay | Admitting: Obstetrics and Gynecology

## 2018-09-10 ENCOUNTER — Ambulatory Visit: Payer: 59 | Admitting: Obstetrics and Gynecology

## 2018-09-16 ENCOUNTER — Encounter: Payer: Self-pay | Admitting: Obstetrics and Gynecology

## 2018-09-16 ENCOUNTER — Ambulatory Visit (INDEPENDENT_AMBULATORY_CARE_PROVIDER_SITE_OTHER): Payer: 59 | Admitting: Obstetrics and Gynecology

## 2018-09-16 VITALS — BP 130/84 | HR 78 | Ht 63.0 in | Wt 158.0 lb

## 2018-09-16 DIAGNOSIS — Z3041 Encounter for surveillance of contraceptive pills: Secondary | ICD-10-CM

## 2018-09-16 DIAGNOSIS — Z1239 Encounter for other screening for malignant neoplasm of breast: Secondary | ICD-10-CM

## 2018-09-16 DIAGNOSIS — Z01419 Encounter for gynecological examination (general) (routine) without abnormal findings: Secondary | ICD-10-CM

## 2018-09-16 DIAGNOSIS — Z3169 Encounter for other general counseling and advice on procreation: Secondary | ICD-10-CM

## 2018-09-16 MED ORDER — TRI-LO-SPRINTEC 0.18/0.215/0.25 MG-25 MCG PO TABS: 1.0000 | ORAL_TABLET | Freq: Every day | ORAL | 3 refills | Status: DC

## 2018-09-16 NOTE — Progress Notes (Signed)
Gynecology Annual Exam   PCP: [facility]  Chief Complaint:  Chief Complaint  Patient presents with  . Gynecologic Exam    Having some BTB the week before a period     History of Present Illness: Patient is a 32 y.o. G0P0000 presents for annual exam. The patient has no complaints today.   LMP: Patient's last menstrual period was 08/25/2018 (approximate). Average Interval: regular, 28 days Duration of flow: 5 days Heavy Menses: no Clots: no Intermenstrual Bleeding: no Postcoital Bleeding: no Dysmenorrhea: no  The patient is sexually active. She currently uses OCP (estrogen/progesterone) for contraception. She denies dyspareunia.  The patient does perform self breast exams.  There is no notable family history of breast or ovarian cancer in her family.  The patient wears seatbelts: yes.   The patient has regular exercise: not asked.    The patient denies current symptoms of depression.    Review of Systems: Review of Systems  Constitutional: Negative for chills and fever.  HENT: Negative for congestion.   Respiratory: Negative for cough and shortness of breath.   Cardiovascular: Negative for chest pain and palpitations.  Gastrointestinal: Negative for abdominal pain, constipation, diarrhea, heartburn, nausea and vomiting.  Genitourinary: Negative for dysuria, frequency and urgency.  Skin: Negative for itching and rash.  Neurological: Negative for dizziness and headaches.  Endo/Heme/Allergies: Negative for polydipsia.  Psychiatric/Behavioral: Negative for depression.    Past Medical History:  Past Medical History:  Diagnosis Date  . Allergic   . Asthma   . GERD (gastroesophageal reflux disease)   . HSV (herpes simplex virus) infection   . Immunization, viral disease    Gardasil vaccine completed    Past Surgical History:  Past Surgical History:  Procedure Laterality Date  . ESOPHAGOGASTRODUODENOSCOPY (EGD) WITH PROPOFOL N/A 09/03/2017   Procedure: ESOPHAGOGASTRODUODENOSCOPY (EGD) WITH PROPOFOL;  Surgeon: Lin Landsman, MD;  Location: [facility];  Service: Endoscopy;  Laterality: N/A;  . HERNIA REPAIR    . MOLE REMOVAL    . TONSILLECTOMY      Gynecologic History:  Patient's last menstrual period was 08/25/2018 (approximate). Contraception: OCP (estrogen/progesterone) Last Pap: Results were: 08/18/2017 no abnormalities   Obstetric History: G0P0000  Family History:  Family History  Problem Relation Age of Onset  . GER disease Mother   . Irritable bowel syndrome Mother   . Hypertension Mother   . Pancreatic cancer Maternal Grandmother 33  . Ovarian cancer Paternal Grandmother 2  . Leukemia Paternal Grandfather   . Breast cancer Other 42       Genetic test negative    Social History:  Social History   Socioeconomic History  . Marital status: Single    Spouse name: Not on file  . Number of children: Not on file  . Years of education: Not on file  . Highest education level: Not on file  Occupational History  . Not on file  Social Needs  . Financial resource strain: Not on file  . Food insecurity:    Worry: Not on file    Inability: Not on file  . Transportation needs:    Medical: Not on file    Non-medical: Not on file  Tobacco Use  . Smoking status: Never Smoker  . Smokeless tobacco: Never Used  Substance and Sexual Activity  . Alcohol use: Yes    Comment: occasional  . Drug use: No  . Sexual activity: Yes    Birth control/protection: Pill  Lifestyle  .  Physical activity:    Days per week: Not on file    Minutes per session: Not on file  . Stress: Not on file  Relationships  . Social connections:    Talks on phone: Not on file    Gets together: Not on file    Attends religious service: Not on file    Active member of club or organization: Not on file    Attends meetings of clubs or organizations: Not on file    Relationship status: Not on file  . Intimate partner violence:     Fear of current or ex partner: Not on file    Emotionally abused: Not on file    Physically abused: Not on file    Forced sexual activity: Not on file  Other Topics Concern  . Not on file  Social History Narrative  . Not on file    Allergies:  Allergies  Allergen Reactions  . Sulfa Antibiotics Hives    Medications: Prior to Admission medications   Medication Sig Start Date End Date Taking? Authorizing Provider  esomeprazole (NEXIUM) 20 MG capsule Take 20 mg by mouth daily at 12 noon.   Yes [provider]  fluticasone (FLONASE) 50 MCG/ACT nasal spray Place into both nostrils daily.   Yes [provider]  predniSONE (DELTASONE) 10 MG tablet Take 6 pills together in the morning for 2 days, 5 for 2 days, 4 for 2 days,3 for 2 days, 2 for 2 days, 1 for 2 days. 09/08/18 09/19/18 Yes [provider]  TRI-LO-SPRINTEC 0.18/0.215/0.25 MG-25 MCG tab TAKE 1 TABLET BY MOUTH EVERY DAY 07/27/18  Yes Malachy Mood, MD  valACYclovir (VALTREX) 500 MG tablet Take by mouth. 05/24/14  Yes [provider]  montelukast (SINGULAIR) 10 MG tablet  07/02/18   [provider]    Physical Exam Vitals: Blood pressure 130/84, pulse 78, height _0  (1.6 m), weight 158 lb (71.7 kg), last menstrual period 08/25/2018.  General: NAD HEENT: normocephalic, anicteric Thyroid: no enlargement, no palpable nodules Pulmonary: No increased work of breathing, CTAB Cardiovascular: RRR, distal pulses 2+ Breast: Breast symmetrical, no tenderness, no palpable nodules or masses, no skin or nipple retraction present, no nipple discharge.  No axillary or supraclavicular lymphadenopathy. Abdomen: NABS, soft, non-tender, non-distended.  Umbilicus without lesions.  No hepatomegaly, splenomegaly or masses palpable. No evidence of hernia  Genitourinary:  External: Normal external female genitalia.  Normal urethral meatus, normal Bartholin's and Skene's glands.    Vagina: Normal  vaginal mucosa, no evidence of prolapse.    Cervix: Grossly normal in appearance, no bleeding  Uterus: Non-enlarged, mobile, normal contour.  No CMT  Adnexa: ovaries non-enlarged, no adnexal masses  Rectal: deferred  Lymphatic: no evidence of inguinal lymphadenopathy Extremities: no edema, erythema, or tenderness Neurologic: Grossly intact Psychiatric: mood appropriate, affect full  Female chaperone present for pelvic and breast  portions of the physical exam    Assessment: 32 y.o. G0P0000 routine annual exam  Plan: Problem List Items Addressed This Visit    None    Visit Diagnoses    Encounter for gynecological examination without abnormal finding    -  Primary   Breast screening       Encounter for surveillance of contraceptive pills       Encounter for preconception consultation          1) STI screening  was notoffered and therefore not obtained  2)  ASCCP guidelines and rational discussed.  Patient opts for every 3  years screening interval  3) Contraception - the patient is currently using  OCP (estrogen/progesterone).  She is happy with her current form of contraception and plans to continue  4) Routine healthcare maintenance including cholesterol, diabetes screening discussed managed by PCP  5) Return in about 1 year (around 09/17/2019) for annual.  - Discussed carrier screening - Discussed BRCA testing - getting married in may considering conceiving after that  Malachy Mood, MD, [place], [facility] 09/16/2018, 10:52 AM

## 2018-11-03 DIAGNOSIS — M26609 Unspecified temporomandibular joint disorder, unspecified side: Secondary | ICD-10-CM | POA: Diagnosis not present

## 2018-11-03 DIAGNOSIS — J3489 Other specified disorders of nose and nasal sinuses: Secondary | ICD-10-CM | POA: Diagnosis not present

## 2018-11-03 DIAGNOSIS — J342 Deviated nasal septum: Secondary | ICD-10-CM | POA: Diagnosis not present

## 2018-11-03 DIAGNOSIS — J301 Allergic rhinitis due to pollen: Secondary | ICD-10-CM | POA: Diagnosis not present

## 2018-11-17 ENCOUNTER — Encounter: Payer: Self-pay | Admitting: Obstetrics and Gynecology

## 2018-11-17 ENCOUNTER — Ambulatory Visit (INDEPENDENT_AMBULATORY_CARE_PROVIDER_SITE_OTHER): Payer: 59 | Admitting: Obstetrics and Gynecology

## 2018-11-17 VITALS — BP 128/84 | HR 77 | Ht 63.0 in | Wt 155.0 lb

## 2018-11-17 DIAGNOSIS — N923 Ovulation bleeding: Secondary | ICD-10-CM

## 2018-11-17 DIAGNOSIS — N939 Abnormal uterine and vaginal bleeding, unspecified: Secondary | ICD-10-CM | POA: Diagnosis not present

## 2018-11-17 NOTE — Progress Notes (Signed)
Gynecology Abnormal Uterine Bleeding Initial Evaluation   Chief Complaint:  Chief Complaint  Patient presents with  . Metrorrhagia    History of Present Illness:    Paitient is a 33 y.o. G0P0000 who LMP was Patient's last menstrual period was 10/20/2018 (approximate)., presents today for a problem visit.  She complains of metrorrhagia (midcycle) that  began several months ago and its severity is described as moderate.  The patient menstrual complaints are acuteShe has regular periods every 28 days and they are associated with mild menstrual cramping.  The patient is sexually active. She currently uses OCP (estrogen/progesterone)for contraception.  Last Pap results esults were obtained 08/18/2017 no abnormalities   Midcycle bleeding getting more pronounced and heavier.  Color dark brown to dark red.  Has not missed and pills in pill pack.  Has become somewhat heavier since recent switch to generic version of tri-sprintec  Previous evaluation: none  Previous Treatment: none.  LMP: Patient's last menstrual period was 10/20/2018 (approximate).  Paramter Normal / Abnormal Prsent  Frequency Amenoorhea     Infrequent (>38 days)     Normal (?24 days ?38 days) X   Freequent (<24 days)    Duration Normal (?8 days) X   Prolonged (>8 days)    Regularity Regular (shortest to longest cycle variation ?7-9 days)*     Irregular (shortest to longest cycle variation ?8-10days)*    Flow Volume Light    (Self reported) Normal X   Heavy        Intermenstrual Bleeding None     Random     Cyclical early     Cyclical mid X   Cyclical late        Unscheduled Bleeding  Not applicable    (exogenous hormones) Absent     Present X   FIGO AUB I System: *The available evidence suggests that, using these criteria, the normal range (shortest to longest) varies with age: 14-25 y of age, ?29 d; 59-41 y, ?7 d; and for 51-45 y, ?9 d    Review of Systems: ROS  Past Medical History:  Past Medical  History:  Diagnosis Date  . Allergic   . Asthma   . GERD (gastroesophageal reflux disease)   . HSV (herpes simplex virus) infection   . Immunization, viral disease    Gardasil vaccine completed    Past Surgical History:  Past Surgical History:  Procedure Laterality Date  . ESOPHAGOGASTRODUODENOSCOPY (EGD) WITH PROPOFOL N/A 09/03/2017   Procedure: ESOPHAGOGASTRODUODENOSCOPY (EGD) WITH PROPOFOL;  Surgeon: Lin Landsman, MD;  Location: [facility];  Service: Endoscopy;  Laterality: N/A;  . HERNIA REPAIR    . MOLE REMOVAL    . TONSILLECTOMY      Obstetric History: G0P0000  Family History:  Family History  Problem Relation Age of Onset  . GER disease Mother   . Irritable bowel syndrome Mother   . Hypertension Mother   . Pancreatic cancer Maternal Grandmother 14  . Ovarian cancer Paternal Grandmother 84  . Leukemia Paternal Grandfather   . Breast cancer Other 82       Genetic test negative    Social History:  Social History   Socioeconomic History  . Marital status: Single    Spouse name: Not on file  . Number of children: Not on file  . Years of education: Not on file  . Highest education level: Not on file  Occupational History  . Not on file  Social Needs  . Financial  resource strain: Not on file  . Food insecurity:    Worry: Not on file    Inability: Not on file  . Transportation needs:    Medical: Not on file    Non-medical: Not on file  Tobacco Use  . Smoking status: Never Smoker  . Smokeless tobacco: Never Used  Substance and Sexual Activity  . Alcohol use: Yes    Comment: occasional  . Drug use: No  . Sexual activity: Yes    Birth control/protection: Pill  Lifestyle  . Physical activity:    Days per week: Not on file    Minutes per session: Not on file  . Stress: Not on file  Relationships  . Social connections:    Talks on phone: Not on file    Gets together: Not on file    Attends religious service: Not on file    Active member  of club or organization: Not on file    Attends meetings of clubs or organizations: Not on file    Relationship status: Not on file  . Intimate partner violence:    Fear of current or ex partner: Not on file    Emotionally abused: Not on file    Physically abused: Not on file    Forced sexual activity: Not on file  Other Topics Concern  . Not on file  Social History Narrative  . Not on file    Allergies:  Allergies  Allergen Reactions  . Sulfa Antibiotics Hives    Medications: Prior to Admission medications   Medication Sig Start Date End Date Taking? Authorizing Provider  esomeprazole (NEXIUM) 20 MG capsule Take 20 mg by mouth daily at 12 noon.   Yes [provider]  montelukast (SINGULAIR) 10 MG tablet  07/02/18  Yes [provider]  TRI-LO-SPRINTEC 0.18/0.215/0.25 MG-25 MCG tab Take 1 tablet by mouth daily. 09/16/18  Yes Malachy Mood, MD  triamcinolone (NASACORT ALLERGY 24HR) 55 MCG/ACT AERO nasal inhaler Place 2 sprays into the nose daily.   Yes [provider]  valACYclovir (VALTREX) 500 MG tablet Take by mouth. 05/24/14  Yes [provider]    Physical Exam Blood pressure 128/84, pulse 77, height 5\' 3"  (1.6 m), weight 155 lb (70.3 kg), last menstrual period 10/20/2018.  Patient's last menstrual period was 10/20/2018 (approximate).  General: NAD HEENT: normocephalic, anicteric Pulmonary: No increased work of breathing Neurologic: Grossly intact Psychiatric: mood appropriate, affect full  Female chaperone present for pelvic portions of the physical exam  Assessment: 33 y.o. G0P0000 with abnormal uterine bleeding  Plan: Problem List Items Addressed This Visit    None    Visit Diagnoses    Abnormal uterine bleeding    -  Primary   Relevant Orders   US Transvaginal Non-OB   TSH+Prl+FSH+TestT+LH+DHEA S...   Intermenstrual bleeding       Relevant Orders   US Transvaginal Non-OB   TSH+Prl+FSH+TestT+LH+DHEA S...      1)  Discussed management options for abnormal uterine bleeding including expectant, NSAIDs, tranexamic acid (Lysteda), oral progesterone (Provera, norethindrone, megace), Depo Provera, Levonorgestrel containing IUD, endometrial ablation (Novasure) or hysterectomy as definitive surgical management.  Discussed risks and benefits of each method.   Final management decision will hinge on results of patient's work up and whether an underlying etiology for the patients bleeding symptoms can be discerned.  We will conduct a basic work up examining using the PALM-COIEN classification system.  In the meantime the patient opts to continue current OCP while we  await results of her ultrasound and labs.  The role of unopposed estrogen in the development of endometrial hyperplasia or carcinoma is discussed.  The risk of endometrial hyperplasia is linearly correlated with increasing BMI given the production of estrone by adipose tissue. Printed patient education handouts were given to the patient to review at home.  Bleeding precautions reviewed.  - TVUS ordered - basic lab work ordered - If normal change from Tri-phasic pill pack to regular pill pack  2) Return in about 1 week (around 11/24/2018) for GYN and TVUS follow up.   Malachy Mood, MD, [facility], [facility] 11/17/2018, 10:20 AM

## 2018-11-19 ENCOUNTER — Other Ambulatory Visit: Payer: Self-pay | Admitting: Obstetrics and Gynecology

## 2018-11-20 LAB — TSH+PRL+FSH+TESTT+LH+DHEA S...
Androstenedione: 15 ng/dL — ABNORMAL LOW (ref 41–262)
DHEA SO4: 58.2 ug/dL — AB (ref 84.8–378.0)
FSH: 12.5 m[IU]/mL
LH: 9 m[IU]/mL
Prolactin: 12.1 ng/mL (ref 4.8–23.3)
TSH: 1.36 u[IU]/mL (ref 0.450–4.500)
Testosterone, Free: 0.4 pg/mL (ref 0.0–4.2)
Testosterone: <3 ng/dL — ABNORMAL LOW (ref 8–48)

## 2018-11-26 ENCOUNTER — Ambulatory Visit (INDEPENDENT_AMBULATORY_CARE_PROVIDER_SITE_OTHER): Payer: 59 | Admitting: Obstetrics and Gynecology

## 2018-11-26 ENCOUNTER — Encounter: Payer: Self-pay | Admitting: Obstetrics and Gynecology

## 2018-11-26 ENCOUNTER — Ambulatory Visit (INDEPENDENT_AMBULATORY_CARE_PROVIDER_SITE_OTHER): Payer: 59

## 2018-11-26 VITALS — BP 124/70 | HR 90 | Wt 150.0 lb

## 2018-11-26 DIAGNOSIS — D251 Intramural leiomyoma of uterus: Secondary | ICD-10-CM

## 2018-11-26 DIAGNOSIS — D250 Submucous leiomyoma of uterus: Secondary | ICD-10-CM

## 2018-11-26 DIAGNOSIS — N888 Other specified noninflammatory disorders of cervix uteri: Secondary | ICD-10-CM

## 2018-11-26 DIAGNOSIS — N923 Ovulation bleeding: Secondary | ICD-10-CM

## 2018-11-26 DIAGNOSIS — N939 Abnormal uterine and vaginal bleeding, unspecified: Secondary | ICD-10-CM | POA: Diagnosis not present

## 2018-11-26 NOTE — Progress Notes (Signed)
Gynecology Ultrasound Follow Up  Chief Complaint:  Chief Complaint  Patient presents with  . Follow-up    GYN U/S     History of Present Illness: Patient is a 33 y.o. female who presents today for ultrasound evaluation of abnormal uterine bleeding, intermenstrual spotting.  Ultrasound demonstrates the following findgins Adnexa: image normally bilaterally Uterus: Uterus with with two small fibroids, with at least one having a mostly submucosal component, normal endometrial stripe   Additional: no free fluid  Review of Systems: ROS  Past Medical History:  Past Medical History:  Diagnosis Date  . Allergic   . Asthma   . GERD (gastroesophageal reflux disease)   . HSV (herpes simplex virus) infection   . Immunization, viral disease    Gardasil vaccine completed    Past Surgical History:  Past Surgical History:  Procedure Laterality Date  . ESOPHAGOGASTRODUODENOSCOPY (EGD) WITH PROPOFOL N/A 09/03/2017   Procedure: ESOPHAGOGASTRODUODENOSCOPY (EGD) WITH PROPOFOL;  Surgeon: Lin Landsman, MD;  Location: [facility];  Service: Endoscopy;  Laterality: N/A;  . HERNIA REPAIR    . MOLE REMOVAL    . TONSILLECTOMY      Gynecologic History:  Patient's last menstrual period was 11/18/2018 (exact date). Contraception: OCP (estrogen/progesterone) Last Pap: 08/18/2017. Results were: .no abnormalities  Family History:  Family History  Problem Relation Age of Onset  . GER disease Mother   . Irritable bowel syndrome Mother   . Hypertension Mother   . Pancreatic cancer Maternal Grandmother 57  . Ovarian cancer Paternal Grandmother 25  . Leukemia Paternal Grandfather   . Breast cancer Other 53       Genetic test negative    Social History:  Social History   Socioeconomic History  . Marital status: Single    Spouse name: Not on file  . Number of children: Not on file  . Years of education: Not on file  . Highest education level: Not on file  Occupational  History  . Not on file  Social Needs  . Financial resource strain: Not on file  . Food insecurity:    Worry: Not on file    Inability: Not on file  . Transportation needs:    Medical: Not on file    Non-medical: Not on file  Tobacco Use  . Smoking status: Never Smoker  . Smokeless tobacco: Never Used  Substance and Sexual Activity  . Alcohol use: Yes    Comment: occasional  . Drug use: No  . Sexual activity: Yes    Birth control/protection: Pill  Lifestyle  . Physical activity:    Days per week: Not on file    Minutes per session: Not on file  . Stress: Not on file  Relationships  . Social connections:    Talks on phone: Not on file    Gets together: Not on file    Attends religious service: Not on file    Active member of club or organization: Not on file    Attends meetings of clubs or organizations: Not on file    Relationship status: Not on file  . Intimate partner violence:    Fear of current or ex partner: Not on file    Emotionally abused: Not on file    Physically abused: Not on file    Forced sexual activity: Not on file  Other Topics Concern  . Not on file  Social History Narrative  . Not on file    Allergies:  Allergies  Allergen  Reactions  . Sulfa Antibiotics Hives    Medications: Prior to Admission medications   Medication Sig Start Date End Date Taking? Authorizing Provider  esomeprazole (NEXIUM) 20 MG capsule Take 20 mg by mouth daily at 12 noon.   Yes [provider]  montelukast (SINGULAIR) 10 MG tablet  07/02/18  Yes [provider]  TRI-LO-SPRINTEC 0.18/0.215/0.25 MG-25 MCG tab Take 1 tablet by mouth daily. 09/16/18  Yes Malachy Mood, MD  triamcinolone (NASACORT ALLERGY 24HR) 55 MCG/ACT AERO nasal inhaler Place 2 sprays into the nose daily.   Yes [provider]  valACYclovir (VALTREX) 500 MG tablet TAKE 1 TABLET BY MOUTH TWICE A DAY 11/19/18  Yes Malachy Mood, MD    Physical Exam Vitals: Blood pressure  124/70, pulse 90, weight 150 lb (68 kg), last menstrual period 11/18/2018.  General: NAD HEENT: normocephalic, anicteric Pulmonary: No increased work of breathing Extremities: no edema, erythema, or tenderness Neurologic: Grossly intact, normal gait Psychiatric: mood appropriate, affect full  US Transvaginal Non-ob  Result Date: 11/26/2018 Patient Name: Erika Moore DOB: 01-06-1986 MRN: [id number] ULTRASOUND REPORT Location: [facility] Date of Service: 11/26/2018 Indications:AUB Findings: The uterus is retroverted and measures 7.5 x 3.9 x 3.2cm. Questionable unicornuate uterus. Echo texture is homogenous with evidence of focal masses. Within the uterus are multiple suspected fibroids measuring: Fibroid 1:  1.7 x 1.6 x 1.5cm (RT/anterior, IM) Fibroid 2:  0.9 x 0.8 x 0.7cm (RT/anterior, SM) The Endometrium measures 4.66mm. Right Ovary measures 3.6 x 2.3 x 1.8cm. Not well seen. Left Ovary measures 3.7 x 2.2 x 1.9cm with 2 dominant follicles seen. Survey of the adnexa demonstrates no adnexal masses. There is no free fluid in the cul de sac. Impression: 1. Two fibroids seen 2. Small nabothian cyst Recommendations: 1.Clinical correlation with the patient's History and Physical Exam. Vita Barley, RDMS RVT Ultrasound today reveals two small uterine fibroids with at least one being mostly submucosal.  Malachy Mood, MD, [place], [facility] 11/26/2018, 2:06 PM     Assessment: 33 y.o. G0P0000 with AUB  Plan: Problem List Items Addressed This Visit    None    Visit Diagnoses    Abnormal uterine bleeding (AUB)    -  Primary   Intramural and submucous leiomyoma of uterus          1) AUB - work up has revealed normal laboratory work up.  Ultrasound today shows at least 1 small submucosal leiomyoma.   - Post for hysteroscopy D&C for myoma removal - I have discussed with the patient the indications for the procedure. Included in the discussion were the options of  therapy, as wall as their individual risks, benefits, and complications. Ample time was given to answer all questions.   In office pipelle biopsy generally provides comparable results to [facility], however this sampling modality may miss focal lesions if these were previously documented on ultrasound.  It is because of the potential to miss focal lesions that hysteroscopy D&C is also warranted in patient with continued postmenopausal bleeding that is not self limited regardless of prior in office biopsy results or ultrasound findings.  She understands that the risk of continued observation include worsening bleeding or worsening of any underlying pathology.  The choices include:While the incidence is low, the risks from this surgery include, but are not limited to, the risks of anesthesia, hemorrhage, infection, perforation, and injury to adjacent structures including bowel, bladder and blood vessels.    2) A total of  15 minutes were spent in face-to-face contact with the patient during this encounter with over half of that time devoted to counseling and coordination of care.  3) Return if symptoms worsen or fail to improve.    Malachy Mood, MD, [facility], [facility] 11/26/2018, 2:11 PM

## 2018-11-27 ENCOUNTER — Telehealth: Payer: Self-pay | Admitting: Obstetrics and Gynecology

## 2018-11-27 NOTE — Telephone Encounter (Signed)
-----   Message from Malachy Mood, MD sent at 11/26/2018  2:11 PM EST ----- Regarding: Surgery Surgery Date:   LOS: same day surgery  Surgery Booking Request Patient Full Name: SHERAN NEWSTROM MRN: [id number]  DOB: 11-13-1985  Surgeon: Malachy Mood, MD  Requested Surgery Date and Time: any time convenient for patient Primary Diagnosis and Code: Submucosal fibroid Secondary Diagnosis and Code: abnormal uterine bleeding Surgical Procedure: Hysteroscopy, D&C L&D Notification:N/A Admission Status: same day surgery Length of Surgery: 1h Special Case Needs: none H&P: week prior or day of (date) Phone Interview or Office Pre-Admit: pre-admit Interpreter: No Language: English Medical Clearance: No Special Scheduling Instructions: none

## 2018-11-27 NOTE — Telephone Encounter (Signed)
No answer, no v/m

## 2018-12-04 NOTE — Telephone Encounter (Signed)
Patient is aware of H&P at [facility] on 01/07/19 @ 8:10a.m. w/ Dr. Georgianne Fick, Pre-admit Testing afterwards, and OR on 01/14/19. Patient is aware she may receive calls from the [facility] and [facility]. Patient confirmed [place], and no secondary insurance. Patient was given the phone# to Same Day Surgery and is aware she should call the day before surgery between 1-3 p.m. for an arrival time. Patient is aware someone should drive her on the day of surgery.

## 2019-01-07 ENCOUNTER — Other Ambulatory Visit: Payer: Self-pay

## 2019-01-07 ENCOUNTER — Ambulatory Visit (INDEPENDENT_AMBULATORY_CARE_PROVIDER_SITE_OTHER): Payer: 59 | Admitting: Obstetrics and Gynecology

## 2019-01-07 ENCOUNTER — Encounter
Admission: RE | Admit: 2019-01-07 | Discharge: 2019-01-07 | Disposition: A | Discharge status: Home / Self Care | Payer: 59 | Source: Ambulatory Visit | Attending: Obstetrics and Gynecology | Admitting: Obstetrics and Gynecology

## 2019-01-07 DIAGNOSIS — Q514 Unicornate uterus: Secondary | ICD-10-CM

## 2019-01-07 DIAGNOSIS — Z01818 Encounter for other preprocedural examination: Secondary | ICD-10-CM

## 2019-01-07 DIAGNOSIS — D250 Submucous leiomyoma of uterus: Secondary | ICD-10-CM | POA: Diagnosis not present

## 2019-01-07 DIAGNOSIS — D251 Intramural leiomyoma of uterus: Secondary | ICD-10-CM

## 2019-01-07 DIAGNOSIS — N939 Abnormal uterine and vaginal bleeding, unspecified: Secondary | ICD-10-CM

## 2019-01-07 HISTORY — DX: Dislocation of jaw, unspecified side, initial encounter: S03.00XA

## 2019-01-07 NOTE — Patient Instructions (Signed)
Your procedure is scheduled on: 01/14/19 Report to Day Surgery.[facility] SECOND FLOOR To find out your arrival time please call [phone] between 1PM - 3PM on 01/13/19.  Remember: Instructions that are not followed completely may result in serious medical risk,  up to and including death, or upon the discretion of your surgeon and anesthesiologist your  surgery may need to be rescheduled.     _X__ 1. Do not eat food after midnight the night before your procedure.                 No gum chewing or hard candies. You may drink clear liquids up to 2 hours                 before you are scheduled to arrive for your surgery- DO not drink clear                 liquids within 2 hours of the start of your surgery.                 Clear Liquids include:  water, apple juice without pulp, clear carbohydrate                 drink such as Clearfast of Gatorade, Black Coffee or Tea (Do not add                 anything to coffee or tea).  __X__2.  On the morning of surgery brush your teeth with toothpaste and water, you                may rinse your mouth with mouthwash if you wish.  Do not swallow any toothpaste of mouthwash.     _X__ 3.  No Alcohol for 24 hours before or after surgery.   _X__ 4.  Do Not Smoke or use e-cigarettes For 24 Hours Prior to Your Surgery.                 Do not use any chewable tobacco products for at least 6 hours prior to                 surgery.  ____  5.  Bring all medications with you on the day of surgery if instructed.   ____  6.  Notify your doctor if there is any change in your medical condition      (cold, fever, infections).     Do not wear jewelry, make-up, hairpins, clips or nail polish. Do not wear lotions, powders, or perfumes. You may wear deodorant. Do not shave 48 hours prior to surgery. Men may shave face and neck. Do not bring valuables to the hospital.    [facility] is not responsible for any belongings or  valuables.  Contacts, dentures or bridgework may not be worn into surgery. Leave your suitcase in the car. After surgery it may be brought to your room. For patients admitted to the hospital, discharge time is determined by your treatment team.   Patients discharged the day of surgery will not be allowed to drive home.           __X__ Take these medicines the morning of surgery with A SIP OF WATER:    1. [facility] AT BEDTIME 01/13/19 AND AM OF SURGERY  2. LORATADINE  3. NASAL SPRAY  4.  5.  6.  ____ Fleet Enema (as directed)   ____ Use CHG Soap as directed  _X___ Use inhalers on  the day of surgery    BRING DAY OF SURGERY IF YOU CAN FIND IT  ____ Stop metformin 2 days prior to surgery    ____ Take 1/2 of usual insulin dose the night before surgery. No insulin the morning          of surgery.   ____ Stop Coumadin/Plavix/aspirin on   ____ Stop Anti-inflammatories on    ____ Stop supplements until after surgery.    ____ Bring C-Pap to the hospital.

## 2019-01-07 NOTE — Progress Notes (Signed)
Obstetrics & Gynecology Surgery H&P    Chief Complaint: Scheduled Surgery   History of Present Illness: Patient is a 33 y.o. G0P0000 presenting for scheduled hysteroscopy, D&C, myomectomy, for the treatment or further evaluation of AUB-L.   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 Allergic   . Asthma   . GERD (gastroesophageal reflux disease)   . HSV (herpes simplex virus) infection   . Immunization, viral disease    Gardasil vaccine completed    Past Surgical History:  Past Surgical History:  Procedure Laterality Date  . ESOPHAGOGASTRODUODENOSCOPY (EGD) WITH PROPOFOL N/A 09/03/2017   Procedure: ESOPHAGOGASTRODUODENOSCOPY (EGD) WITH PROPOFOL;  Surgeon: Lin Landsman, MD;  Location: [facility];  Service: Endoscopy;  Laterality: N/A;  . HERNIA REPAIR    . MOLE REMOVAL    . TONSILLECTOMY      Family History:  Family History  Problem Relation Age of Onset  . GER disease Mother   . Irritable bowel syndrome Mother   . Hypertension Mother   . Pancreatic cancer Maternal Grandmother 50  . Ovarian cancer Paternal Grandmother 79  . Leukemia Paternal Grandfather   . Breast cancer Other 70       Genetic test negative    Social History:  Social History   Socioeconomic History  . Marital status: Single    Spouse name: Not on file  . Number of children: Not on file  . Years of education: Not on file  . Highest education level: Not on file  Occupational History  . Not on file  Social Needs  . Financial resource strain: Not on file  . Food insecurity:    Worry: Not on file    Inability: Not on file  . Transportation needs:    Medical: Not on file   Non-medical: Not on file  Tobacco Use  . Smoking status: Never Smoker  . Smokeless tobacco: Never Used  Substance and Sexual Activity  . Alcohol use: Yes    Comment: occasional  . Drug use: No  . Sexual activity: Yes    Birth control/protection: Pill  Lifestyle  . Physical activity:    Days per week: Not on file    Minutes per session: Not on file  . Stress: Not on file  Relationships  . Social connections:    Talks on phone: Not on file    Gets together: Not on file    Attends religious service: Not on file    Active member of club or organization: Not on file    Attends meetings of clubs or organizations: Not on file    Relationship status: Not on file  . Intimate partner violence:    Fear of current or ex partner: Not on file    Emotionally abused: Not on file    Physically abused: Not on file    Forced sexual activity: Not on file  Other Topics Concern  . Not on file  Social History Narrative  . Not on file    Allergies:  Allergies  Allergen Reactions  . Sulfa Antibiotics Hives    Medications: Prior to Admission medications   Medication Sig Start Date End Date Taking? Authorizing Provider  esomeprazole (NEXIUM) 20 MG capsule Take 20 mg by mouth  every morning.     [provider]  montelukast (SINGULAIR) 10 MG tablet Take 10 mg by mouth at bedtime.  07/02/18   [provider]  TRI-LO-SPRINTEC 0.18/0.215/0.25 MG-25 MCG tab Take 1 tablet by mouth daily. 09/16/18   Malachy Mood, MD  triamcinolone (NASACORT ALLERGY 24HR) 55 MCG/ACT AERO nasal inhaler Place 2 sprays into the nose daily.    [provider]  valACYclovir (VALTREX) 500 MG tablet TAKE 1 TABLET BY MOUTH TWICE A DAY Patient taking differently: Take 500 mg by mouth 2 (two) times daily as needed (cold sores).  11/19/18   Malachy Mood, MD    Physical Exam Vitals: There were no vitals taken for this visit.  General: NAD HEENT: normocephalic, anicteric Pulmonary: No  increased work of breathing, CTAB Cardiovascular: RRR, distal pulses 2+ Abdomen: soft, non-tender Genitourinary: deferred Extremities: no edema, erythema, or tenderness Neurologic: Grossly intact Psychiatric: mood appropriate, affect full  Recent Results (from the past 2160 hour(s))  TSH+Prl+FSH+TestT+LH+DHEA S...     Status: Abnormal   Collection Time: 11/17/18 10:33 AM  Result Value Ref Range   TSH 1.360 0.450 - 4.500 uIU/mL   Testosterone <3 (L) 8 - 48 ng/dL   Testosterone, Free 0.4 0.0 - 4.2 pg/mL   LH 9.0 mIU/mL    Comment:                     Adult Female:                       Follicular phase      2.4 -  12.6                       Ovulation phase      14.0 -  95.6                       Luteal phase          1.0 -  11.4                       Postmenopausal        7.7 -  58.5    FSH 12.5 mIU/mL    Comment:                     Adult Female:                       Follicular phase      3.5 -  12.5                       Ovulation phase       4.7 -  21.5                       Luteal phase          1.7 -   7.7                       Postmenopausal       25.8 - 134.8    Prolactin 12.1 4.8 - 23.3 ng/mL   17-Hydroxyprogesterone <10 ng/dL    Comment:                           Adult Female  Follicular        15 -  70                            Luteal            35 - 290    DHEA-SO4 58.2 (L) 84.8 - 378.0 ug/dL   Androstenedione 15 (L) 41 - 262 ng/dL    Comment: This test was developed and its performance characteristics determined by [facility]. It has not been cleared or approved by the Food and Drug Administration.    No results found.   Imaging No results found.  Assessment: 33 y.o. G0P0000 presenting for scheduled hysteroscopy, D&C, myomectomy  Plan: 1) I have discussed with the patient the indications for the procedure. Included in the discussion were the options of therapy, as wall as their individual risks, benefits, and complications. Ample  time was given to answer all questions.   In office pipelle biopsy generally provides comparable results to [facility], however this sampling modality may miss focal lesions if these were previously documented on ultrasound.  It is because of the potential to miss focal lesions that hysteroscopy D&C is also warranted in patient with continued postmenopausal bleeding that is not self limited regardless of prior in office biopsy results or ultrasound findings.  She understands that the risk of continued observation include worsening bleeding or worsening of any underlying pathology.  The choices include: 1. Doing nothing but following her symptoms 2. Attempts at hormonal manipulation with either BCP or Depo-Provera for premenopausal patients with no concern for focal lesion or endometrial pathology 3. D&C/hysteroscopy. 4. Endometrial ablation via Novasure or other techniques for premenopausal patients with no concern for focal lesion or endometrial pathology  5. As final resort, hysterectomy. After consideration of her history and findings, mutual decision has been made to proceed with D+C/hysteroscopy. While the incidence is low, the risks from this surgery include, but are not limited to, the risks of anesthesia, hemorrhage, infection, perforation, and injury to adjacent structures including bowel, bladder and blood vessels.   2) Routine postoperative instructions were reviewed with the patient and her family in detail today including the expected length of recovery and likely postoperative course.  The patient concurred with the proposed plan, giving informed written consent for the surgery today.  Patient instructed on the importance of being NPO after midnight prior to her procedure.  If warranted preoperative prophylactic antibiotics and SCDs ordered on call to the OR to meet SCIP guidelines and adhere to recommendation laid forth in [place] Number 104 May 2009  "Antibiotic Prophylaxis for Gynecologic  Procedures".     Malachy Mood, MD, [facility], [facility] 01/07/2019, 8:43 AM

## 2019-01-12 ENCOUNTER — Telehealth: Payer: Self-pay

## 2019-01-12 NOTE — Telephone Encounter (Signed)
Ok to move back surgery as elective.  Bigger question is when to I think at present we just need to keep a list of patient who need to reschedule but hold off on scheduling until we get the all clear to reschedule

## 2019-01-12 NOTE — Telephone Encounter (Signed)
Pt has procedure scheduled for Thursday.  She is thinking about rescheduling to later in the year.  Wants to discuss.  News is saying not to have elective surgery.  Please call.  [phone]

## 2019-01-12 NOTE — Telephone Encounter (Signed)
advise

## 2019-01-14 ENCOUNTER — Ambulatory Visit: Admission: RE | Admit: 2019-01-14 | Payer: 59 | Source: Home / Self Care | Admitting: Obstetrics and Gynecology

## 2019-01-14 ENCOUNTER — Encounter: Admission: RE | Payer: Self-pay | Source: Home / Self Care

## 2019-01-14 SURGERY — DILATATION AND CURETTAGE /HYSTEROSCOPY
Anesthesia: Choice

## 2019-02-18 DIAGNOSIS — H9201 Otalgia, right ear: Secondary | ICD-10-CM | POA: Diagnosis not present

## 2019-03-04 ENCOUNTER — Telehealth: Payer: Self-pay | Admitting: Obstetrics and Gynecology

## 2019-03-04 NOTE — Telephone Encounter (Signed)
Attempted to reach the patient to reschedule surgery. No answer, v/m is full.

## 2019-03-09 NOTE — Telephone Encounter (Signed)
No answer , vm is full

## 2019-03-12 NOTE — Telephone Encounter (Signed)
No answer , vm is full

## 2019-03-12 NOTE — Telephone Encounter (Signed)
Sent My-Chart message

## 2019-05-03 ENCOUNTER — Telehealth: Payer: Self-pay | Admitting: Obstetrics and Gynecology

## 2019-05-03 DIAGNOSIS — M25561 Pain in right knee: Secondary | ICD-10-CM | POA: Diagnosis not present

## 2019-05-03 DIAGNOSIS — M65869 Other synovitis and tenosynovitis, unspecified lower leg: Secondary | ICD-10-CM | POA: Diagnosis not present

## 2019-05-03 DIAGNOSIS — M65861 Other synovitis and tenosynovitis, right lower leg: Secondary | ICD-10-CM | POA: Diagnosis not present

## 2019-05-03 NOTE — Telephone Encounter (Signed)
Patient is aware of H&P at [facility] on 06/14/19 @ 8:10am, Preadmit testing and COVID testing to be scheduled, and OR on 06/24/19. Patient is aware she will be asked to quarantine after COVID testing. Patient is aware she may receive calls from the [place] and [facility]. Patient confirmed [employer]. Patient is aware no visitors.

## 2019-06-14 ENCOUNTER — Ambulatory Visit (INDEPENDENT_AMBULATORY_CARE_PROVIDER_SITE_OTHER): Payer: 59 | Admitting: Obstetrics and Gynecology

## 2019-06-14 ENCOUNTER — Encounter: Payer: Self-pay | Admitting: Obstetrics and Gynecology

## 2019-06-14 ENCOUNTER — Other Ambulatory Visit: Payer: Self-pay

## 2019-06-14 VITALS — BP 110/62 | HR 73 | Ht 63.0 in | Wt 154.0 lb

## 2019-06-14 DIAGNOSIS — N939 Abnormal uterine and vaginal bleeding, unspecified: Secondary | ICD-10-CM

## 2019-06-14 DIAGNOSIS — N840 Polyp of corpus uteri: Secondary | ICD-10-CM | POA: Diagnosis not present

## 2019-06-14 NOTE — Progress Notes (Signed)
Obstetrics & Gynecology Surgery H&P    Chief Complaint: Scheduled Surgery   History of Present Illness: Patient is a 33 y.o. G0P0000 presenting for scheduled hysteroscopy, D&C, for the treatment or further evaluation of abnormal uterine bleeding.   Prior Treatments prior to proceeding with surgery include: OCP  Preoperative Pap: 08/18/2017 Results: no abnormalities  Preoperative Endometrial biopsy: N/A focal lesion Preoperative Ultrasound: 11/26/2018 Findings: endometrial polyp   Review of Systems:10 point review of systems  Past Medical History:  Past Medical History:  Diagnosis Date  . Allergic   . Asthma   . GERD (gastroesophageal reflux disease)   . HSV (herpes simplex virus) infection   . Immunization, viral disease    Gardasil vaccine completed  . TMJ (dislocation of temporomandibular joint)     Past Surgical History:  Past Surgical History:  Procedure Laterality Date  . ESOPHAGOGASTRODUODENOSCOPY (EGD) WITH PROPOFOL N/A 09/03/2017   Procedure: ESOPHAGOGASTRODUODENOSCOPY (EGD) WITH PROPOFOL;  Surgeon: Lin Landsman, MD;  Location: [facility];  Service: Endoscopy;  Laterality: N/A;  . HERNIA REPAIR    . MOLE REMOVAL    . TONSILLECTOMY      Family History:  Family History  Problem Relation Age of Onset  . GER disease Mother   . Irritable bowel syndrome Mother   . Hypertension Mother   . Pancreatic cancer Maternal Grandmother 62  . Ovarian cancer Paternal Grandmother 38  . Leukemia Paternal Grandfather   . Breast cancer Other 58       Genetic test negative    Social History:  Social History   Socioeconomic History  . Marital status: Married    Spouse name: Not on file  . Number of children: Not on file  . Years of education: Not on file  . Highest education level: Not on file  Occupational History  . Not on file  Social Needs  . Financial resource strain: Not on file  . Food insecurity    Worry: Not on file    Inability: Not on  file  . Transportation needs    Medical: Not on file    Non-medical: Not on file  Tobacco Use  . Smoking status: Never Smoker  . Smokeless tobacco: Never Used  Substance and Sexual Activity  . Alcohol use: Yes    Comment: occasional  . Drug use: No  . Sexual activity: Yes    Birth control/protection: Pill  Lifestyle  . Physical activity    Days per week: Not on file    Minutes per session: Not on file  . Stress: Not on file  Relationships  . Social Herbalist on phone: Not on file    Gets together: Not on file    Attends religious service: Not on file    Active member of club or organization: Not on file    Attends meetings of clubs or organizations: Not on file    Relationship status: Not on file  . Intimate partner violence    Fear of current or ex partner: Not on file    Emotionally abused: Not on file    Physically abused: Not on file    Forced sexual activity: Not on file  Other Topics Concern  . Not on file  Social History Narrative  . Not on file    Allergies:  Allergies  Allergen Reactions  . Other     [place]S  . Sulfa Antibiotics Hives    Medications: Prior to Admission medications  Medication Sig Start Date End Date Taking? Authorizing Provider  esomeprazole (NEXIUM) 20 MG capsule Take 20 mg by mouth every morning.    Yes [provider]  montelukast (SINGULAIR) 10 MG tablet Take 10 mg by mouth at bedtime.  07/02/18  Yes [provider]  TRI-LO-SPRINTEC 0.18/0.215/0.25 MG-25 MCG tab Take 1 tablet by mouth daily. 09/16/18  Yes Malachy Mood, MD  triamcinolone (NASACORT ALLERGY 24HR) 55 MCG/ACT AERO nasal inhaler Place 2 sprays into the nose daily.   Yes [provider]  valACYclovir (VALTREX) 500 MG tablet TAKE 1 TABLET BY MOUTH TWICE A DAY Patient taking differently: Take 500 mg by mouth 2 (two) times daily as needed (cold sores).  11/19/18  Yes Malachy Mood, MD    Physical Exam Vitals: Blood pressure  110/62, pulse 73, height 5\' 3"  (1.6 m), weight 154 lb (69.9 kg), last menstrual period 05/31/2019. General: NAD HEENT: normocephalic, anicteric Pulmonary: No increased work of breathing Cardiovascular: RRR, distal pulses 2+ Abdomen: soft, non-tender, non-distended Genitourinary: deferred Extremities: no edema, erythema, or tenderness Neurologic: Grossly intact Psychiatric: mood appropriate, affect full  Imaging No results found.  Assessment: 33 y.o. G0P0000 presenting for scheduled  Hysteroscopy, D&C  Plan: 1) I have discussed with the patient the indications for the procedure. Included in the discussion were the options of therapy, as wall as their individual risks, benefits, and complications. Ample time was given to answer all questions.   In office pipelle biopsy generally provides comparable results to [facility], however this sampling modality may miss focal lesions if these were previously documented on ultrasound.  It is because of the potential to miss focal lesions that hysteroscopy D&C is also warranted in patient with continued postmenopausal bleeding that is not self limited regardless of prior in office biopsy results or ultrasound findings.  She understands that the risk of continued observation include worsening bleeding or worsening of any underlying pathology.  The choices include: 1. Doing nothing but following her symptoms 2. Attempts at hormonal manipulation with either BCP or Depo-Provera for premenopausal patients with no concern for focal lesion or endometrial pathology 3. D&C/hysteroscopy. After consideration of her history and findings, mutual decision has been made to proceed with D+C/hysteroscopy. While the incidence is low, the risks from this surgery include, but are not limited to, the risks of anesthesia, hemorrhage, infection, perforation, and injury to adjacent structures including bowel, bladder and blood vessels.    2) Routine postoperative instructions were reviewed  with the patient and her family in detail today including the expected length of recovery and likely postoperative course.  The patient concurred with the proposed plan, giving informed written consent for the surgery today.  Patient instructed on the importance of being NPO after midnight prior to her procedure.  If warranted preoperative prophylactic antibiotics and SCDs ordered on call to the OR to meet SCIP guidelines and adhere to recommendation laid forth in [place] Number 104 May 2009  "Antibiotic Prophylaxis for Gynecologic Procedures".    3) Return in about 2 weeks (around 06/28/2019) for postop.    Malachy Mood, MD, [facility], [facility] 06/14/2019, 8:29 AM

## 2019-06-21 ENCOUNTER — Encounter
Admission: RE | Admit: 2019-06-21 | Discharge: 2019-06-21 | Disposition: A | Discharge status: Home / Self Care | Payer: 59 | Source: Ambulatory Visit | Attending: Obstetrics and Gynecology | Admitting: Obstetrics and Gynecology

## 2019-06-21 ENCOUNTER — Other Ambulatory Visit: Payer: Self-pay

## 2019-06-21 DIAGNOSIS — Z01812 Encounter for preprocedural laboratory examination: Secondary | ICD-10-CM | POA: Insufficient documentation

## 2019-06-21 DIAGNOSIS — Z79899 Other long term (current) drug therapy: Secondary | ICD-10-CM | POA: Diagnosis not present

## 2019-06-21 DIAGNOSIS — N840 Polyp of corpus uteri: Secondary | ICD-10-CM | POA: Diagnosis not present

## 2019-06-21 DIAGNOSIS — N939 Abnormal uterine and vaginal bleeding, unspecified: Secondary | ICD-10-CM | POA: Diagnosis not present

## 2019-06-21 DIAGNOSIS — Z793 Long term (current) use of hormonal contraceptives: Secondary | ICD-10-CM | POA: Diagnosis not present

## 2019-06-21 DIAGNOSIS — K219 Gastro-esophageal reflux disease without esophagitis: Secondary | ICD-10-CM | POA: Diagnosis not present

## 2019-06-21 DIAGNOSIS — Z20828 Contact with and (suspected) exposure to other viral communicable diseases: Secondary | ICD-10-CM | POA: Insufficient documentation

## 2019-06-21 DIAGNOSIS — J45909 Unspecified asthma, uncomplicated: Secondary | ICD-10-CM | POA: Diagnosis not present

## 2019-06-21 LAB — SARS CORONAVIRUS 2 (TAT 6-24 HRS): SARS Coronavirus 2: NEGATIVE

## 2019-06-21 NOTE — Patient Instructions (Signed)
Your procedure is scheduled on: 06/24/19 Report to Day Surgery.[facility] SECOND FLOOR To find out your arrival time please call [phone] between 1PM - 3PM on 06/23/19  Remember: Instructions that are not followed completely may result in serious medical risk,  up to and including death, or upon the discretion of your surgeon and anesthesiologist your  surgery may need to be rescheduled.     _X__ 1. Do not eat food after midnight the night before your procedure.                 No gum chewing or hard candies. You may drink clear liquids up to 2 hours                 before you are scheduled to arrive for your surgery- DO not drink clear                 liquids within 2 hours of the start of your surgery.                 Clear Liquids include:  water, apple juice without pulp, clear carbohydrate                 drink such as Clearfast of Gatorade, Black Coffee or Tea (Do not add                 anything to coffee or tea).  __X__2.  On the morning of surgery brush your teeth with toothpaste and water, you                may rinse your mouth with mouthwash if you wish.  Do not swallow any toothpaste of mouthwash.     _X__ 3.  No Alcohol for 24 hours before or after surgery.   _X__ 4.  Do Not Smoke or use e-cigarettes For 24 Hours Prior to Your Surgery.                 Do not use any chewable tobacco products for at least 6 hours prior to                 surgery.  ____  5.  Bring all medications with you on the day of surgery if instructed.   __X__  6.  Notify your doctor if there is any change in your medical condition      (cold, fever, infections).     Do not wear jewelry, make-up, hairpins, clips or nail polish. Do not wear lotions, powders, or perfumes. You may wear deodorant. Do not shave 48 hours prior to surgery. Men may shave face and neck. Do not bring valuables to the hospital.    [facility] is not responsible for any belongings or  valuables.  Contacts, dentures or bridgework may not be worn into surgery. Leave your suitcase in the car. After surgery it may be brought to your room. For patients admitted to the hospital, discharge time is determined by your treatment team.   Patients discharged the day of surgery will not be allowed to drive home.   \          _X___ Take these medicines the morning of surgery with A SIP OF WATER:    1. [place]  2.   3.   4.  5.  6.  ____ Fleet Enema (as directed)   ____ Use CHG Soap as directed  __X__ Use inhalers on the day of surgery AND BRING  ____ Stop metformin 2 days prior to surgery    ____ Take 1/2 of usual insulin dose the night before surgery. No insulin the morning          of surgery.   ____ Stop Coumadin/Plavix/aspirin on  ____ Stop Anti-inflammatories on    ____ Stop supplements until after surgery.    ____ Bring C-Pap to the hospital.

## 2019-06-24 ENCOUNTER — Encounter: Payer: Self-pay | Admitting: Certified Registered Nurse Anesthetist

## 2019-06-24 ENCOUNTER — Encounter
Admission: RE | Disposition: A | Discharge status: Home / Self Care | Payer: Self-pay | Source: Home / Self Care | Attending: Obstetrics and Gynecology

## 2019-06-24 ENCOUNTER — Other Ambulatory Visit: Payer: Self-pay

## 2019-06-24 ENCOUNTER — Ambulatory Visit: Payer: 59 | Admitting: Certified Registered Nurse Anesthetist

## 2019-06-24 ENCOUNTER — Ambulatory Visit
Admission: RE | Admit: 2019-06-24 | Discharge: 2019-06-24 | Disposition: A | Discharge status: Home / Self Care | Payer: 59 | Attending: Obstetrics and Gynecology | Admitting: Obstetrics and Gynecology

## 2019-06-24 DIAGNOSIS — J45909 Unspecified asthma, uncomplicated: Secondary | ICD-10-CM | POA: Diagnosis not present

## 2019-06-24 DIAGNOSIS — N840 Polyp of corpus uteri: Secondary | ICD-10-CM | POA: Insufficient documentation

## 2019-06-24 DIAGNOSIS — Z793 Long term (current) use of hormonal contraceptives: Secondary | ICD-10-CM | POA: Diagnosis not present

## 2019-06-24 DIAGNOSIS — Z20828 Contact with and (suspected) exposure to other viral communicable diseases: Secondary | ICD-10-CM | POA: Diagnosis not present

## 2019-06-24 DIAGNOSIS — Z79899 Other long term (current) drug therapy: Secondary | ICD-10-CM | POA: Insufficient documentation

## 2019-06-24 DIAGNOSIS — D250 Submucous leiomyoma of uterus: Secondary | ICD-10-CM | POA: Diagnosis not present

## 2019-06-24 DIAGNOSIS — K219 Gastro-esophageal reflux disease without esophagitis: Secondary | ICD-10-CM | POA: Insufficient documentation

## 2019-06-24 DIAGNOSIS — Z9889 Other specified postprocedural states: Secondary | ICD-10-CM

## 2019-06-24 DIAGNOSIS — N939 Abnormal uterine and vaginal bleeding, unspecified: Secondary | ICD-10-CM | POA: Diagnosis not present

## 2019-06-24 DIAGNOSIS — Z7689 Persons encountering health services in other specified circumstances: Secondary | ICD-10-CM | POA: Diagnosis not present

## 2019-06-24 HISTORY — PX: HYSTEROSCOPY WITH D & C: SHX1775

## 2019-06-24 LAB — POCT PREGNANCY, URINE: Preg Test, Ur: NEGATIVE

## 2019-06-24 SURGERY — DILATATION AND CURETTAGE /HYSTEROSCOPY
Anesthesia: General

## 2019-06-24 MED ORDER — SUCCINYLCHOLINE CHLORIDE 20 MG/ML IJ SOLN
INTRAMUSCULAR | Status: DC | PRN
  Administered 2019-06-24: 40 mg via INTRAVENOUS

## 2019-06-24 MED ORDER — PROPOFOL 10 MG/ML IV BOLUS
INTRAVENOUS | Status: AC
  Filled 2019-06-24: qty 20

## 2019-06-24 MED ORDER — ACETAMINOPHEN 10 MG/ML IV SOLN
INTRAVENOUS | Status: DC | PRN
  Administered 2019-06-24: 1000 mg via INTRAVENOUS

## 2019-06-24 MED ORDER — FENTANYL CITRATE (PF) 100 MCG/2ML IJ SOLN
INTRAMUSCULAR | Status: DC | PRN
  Administered 2019-06-24: 25 ug via INTRAVENOUS
  Administered 2019-06-24: 50 ug via INTRAVENOUS
  Administered 2019-06-24: 25 ug via INTRAVENOUS

## 2019-06-24 MED ORDER — LIDOCAINE HCL (PF) 2 % IJ SOLN
INTRAMUSCULAR | Status: AC
  Filled 2019-06-24: qty 10

## 2019-06-24 MED ORDER — SUCCINYLCHOLINE CHLORIDE 20 MG/ML IJ SOLN
INTRAMUSCULAR | Status: AC
  Filled 2019-06-24: qty 1

## 2019-06-24 MED ORDER — FENTANYL CITRATE (PF) 100 MCG/2ML IJ SOLN
INTRAMUSCULAR | Status: AC
  Filled 2019-06-24: qty 2

## 2019-06-24 MED ORDER — PROPOFOL 10 MG/ML IV BOLUS
INTRAVENOUS | Status: AC
  Filled 2019-06-24: qty 40

## 2019-06-24 MED ORDER — MEPERIDINE HCL 50 MG/ML IJ SOLN
INTRAMUSCULAR | Status: AC
  Administered 2019-06-24: 13:00:00 12.5 mg via INTRAVENOUS
  Filled 2019-06-24: qty 1

## 2019-06-24 MED ORDER — MIDAZOLAM HCL 2 MG/2ML IJ SOLN
INTRAMUSCULAR | Status: DC | PRN
  Administered 2019-06-24: 2 mg via INTRAVENOUS

## 2019-06-24 MED ORDER — MEPERIDINE HCL 50 MG/ML IJ SOLN
12.5000 mg | Freq: Once | INTRAMUSCULAR | Status: AC
  Administered 2019-06-24: 13:00:00 12.5 mg via INTRAVENOUS

## 2019-06-24 MED ORDER — OXYCODONE HCL 5 MG/5ML PO SOLN: 5.0000 mg | Freq: Once | ORAL | Status: DC | PRN

## 2019-06-24 MED ORDER — DEXAMETHASONE SODIUM PHOSPHATE 10 MG/ML IJ SOLN
INTRAMUSCULAR | Status: DC | PRN
  Administered 2019-06-24: 8 mg via INTRAVENOUS

## 2019-06-24 MED ORDER — OXYCODONE HCL 5 MG PO TABS: 5.0000 mg | ORAL_TABLET | Freq: Once | ORAL | Status: DC | PRN

## 2019-06-24 MED ORDER — PROMETHAZINE HCL 25 MG/ML IJ SOLN: 6.2500 mg | INTRAMUSCULAR | Status: DC | PRN

## 2019-06-24 MED ORDER — ONDANSETRON HCL 4 MG/2ML IJ SOLN
INTRAMUSCULAR | Status: AC
  Filled 2019-06-24: qty 2

## 2019-06-24 MED ORDER — DEXAMETHASONE SODIUM PHOSPHATE 10 MG/ML IJ SOLN
INTRAMUSCULAR | Status: AC
  Filled 2019-06-24: qty 1

## 2019-06-24 MED ORDER — DIPHENHYDRAMINE HCL 50 MG/ML IJ SOLN
INTRAMUSCULAR | Status: AC
  Filled 2019-06-24: qty 1

## 2019-06-24 MED ORDER — ONDANSETRON HCL 4 MG/2ML IJ SOLN
INTRAMUSCULAR | Status: DC | PRN
  Administered 2019-06-24: 4 mg via INTRAVENOUS

## 2019-06-24 MED ORDER — LIDOCAINE HCL (CARDIAC) PF 100 MG/5ML IV SOSY
PREFILLED_SYRINGE | INTRAVENOUS | Status: DC | PRN
  Administered 2019-06-24: 60 mg via INTRAVENOUS

## 2019-06-24 MED ORDER — ACETAMINOPHEN 10 MG/ML IV SOLN
INTRAVENOUS | Status: AC
  Filled 2019-06-24: qty 100

## 2019-06-24 MED ORDER — MIDAZOLAM HCL 2 MG/2ML IJ SOLN
INTRAMUSCULAR | Status: AC
  Filled 2019-06-24: qty 2

## 2019-06-24 MED ORDER — PROPOFOL 10 MG/ML IV BOLUS
INTRAVENOUS | Status: DC | PRN
  Administered 2019-06-24: 60 mg via INTRAVENOUS
  Administered 2019-06-24: 120 mg via INTRAVENOUS
  Administered 2019-06-24: 50 mg via INTRAVENOUS

## 2019-06-24 MED ORDER — FENTANYL CITRATE (PF) 100 MCG/2ML IJ SOLN: 25.0000 ug | INTRAMUSCULAR | Status: DC | PRN

## 2019-06-24 MED ORDER — LACTATED RINGERS IV SOLN
INTRAVENOUS | Status: DC | PRN
  Administered 2019-06-24: 11:00:00 via INTRAVENOUS

## 2019-06-24 SURGICAL SUPPLY — 26 items
BAG INFUSER PRESSURE 100CC (MISCELLANEOUS) ×2 IMPLANT
CATH ROBINSON RED A/P 16FR (CATHETERS) ×3 IMPLANT
COVER WAND RF STERILE (DRAPES) IMPLANT
DEVICE MYOSURE LITE (MISCELLANEOUS) ×2 IMPLANT
ELECT REM PT RETURN 9FT ADLT (ELECTROSURGICAL)
ELECTRODE REM PT RTRN 9FT ADLT (ELECTROSURGICAL) IMPLANT
GLOVE BIO SURGEON STRL SZ7 (GLOVE) ×3 IMPLANT
GLOVE INDICATOR 7.5 STRL GRN (GLOVE) ×3 IMPLANT
GOWN STRL REUS W/ TWL LRG LVL3 (GOWN DISPOSABLE) ×2 IMPLANT
GOWN STRL REUS W/TWL LRG LVL3 (GOWN DISPOSABLE) ×4
IV LACTATED RINGER IRRG 3000ML (IV SOLUTION)
IV LR IRRIG 3000ML ARTHROMATIC (IV SOLUTION) IMPLANT
IV NS 1000ML (IV SOLUTION) ×2
IV NS 1000ML BAXH (IV SOLUTION) IMPLANT
IV NS IRRIG 3000ML ARTHROMATIC (IV SOLUTION) ×1 IMPLANT
KIT PROCEDURE FLUENT (KITS) ×3 IMPLANT
KIT TURNOVER CYSTO (KITS) ×3 IMPLANT
MYOSURE XL FIBROID (MISCELLANEOUS)
PACK DNC HYST (MISCELLANEOUS) ×3 IMPLANT
PAD OB MATERNITY 4.3X12.25 (PERSONAL CARE ITEMS) ×3 IMPLANT
PAD PREP 24X41 OB/GYN DISP (PERSONAL CARE ITEMS) ×3 IMPLANT
SEAL ROD LENS SCOPE MYOSURE (ABLATOR) ×3 IMPLANT
SYSTEM TISS REMOVAL MYOSURE XL (MISCELLANEOUS) IMPLANT
TOWEL OR 17X26 4PK STRL BLUE (TOWEL DISPOSABLE) ×3 IMPLANT
TUBING CONNECTING 10 (TUBING) ×2 IMPLANT
TUBING CONNECTING 10' (TUBING) ×1

## 2019-06-24 NOTE — Anesthesia Postprocedure Evaluation (Signed)
Anesthesia Post Note  Patient: Erika Moore  Procedure(s) Performed: DILATATION AND CURETTAGE /HYSTEROSCOPY (N/A )  Patient location during evaluation: PACU Anesthesia Type: General Level of consciousness: awake and alert Pain management: pain level controlled Vital Signs Assessment: post-procedure vital signs reviewed and stable Respiratory status: spontaneous breathing, nonlabored ventilation and respiratory function stable Cardiovascular status: blood pressure returned to baseline and stable Postop Assessment: no apparent nausea or vomiting Anesthetic complications: no     Last Vitals:  Vitals:   06/24/19 1322 06/24/19 1342  BP: 126/61 122/75  Pulse: 70 71  Resp: 18 18  Temp: 36.7 C   SpO2: 100% 100%    Last Pain:  Vitals:   06/24/19 1342  TempSrc:   PainSc: 0-No pain                 Durenda Hurt

## 2019-06-24 NOTE — Anesthesia Post-op Follow-up Note (Signed)
Anesthesia QCDR form completed.        

## 2019-06-24 NOTE — H&P (Signed)
Date of Initial H&P: 06/13/2016  History reviewed, patient examined, no change in status, stable for surgery.

## 2019-06-24 NOTE — Op Note (Signed)
Preoperative Diagnosis: 1) 33 y.o. with abnormal uterine bleeding 2) Endometrial polyp  Postoperative Diagnosis: 1) 33 y.o. with with abnormal uterine bleeding 2) Endometrial polyps  Operation Performed: Hysteroscopy, dilation and curettage  Indication: Abnormal uterine bleeding with ultrasound suggestive of endometrial polyp  Anesthesia: General  Primary Surgeon: Malachy Mood, MD  Assistant: none  Preoperative Antibiotics: none  Estimated Blood Loss: 2 mL  Urine Output:: ~69mL straight cath  Drains or Tubes: none  Implants: none  Specimens Removed: endometrial curettings  Complications: none  Intraoperative Findings:  Normal appearing nulliparous cervix, normal cervical canal.  Endometrial cavity with normal contour, normal bilateral ostia.  There was a small polypoid fragment in the central fundus, anterior fundus, and another noted on the left wall of the uterus.    Patient Condition: stable  Procedure in Detail:  Patient was taken to the operating room were she was administered general endotracheal anesthesia.  She was positioned in the dorsal lithotomy position utilizing Allen stirups, prepped and draped in the usual sterile fashion.  Uterus was noted to be non-enlarged in size, anteverted.   Prior to proceeding with the case a time out was performed.  Attention was turned to the patient's pelvis.  A red rubber catheter was used to empty the patient's bladder.  An operative speculum was placed to allow visualization of the cervix.  The anterior lip of the cervix was grasped with a single tooth tenaculum, sounded to 8.5cm, and the cervix was sequentially dilated using pratt dilators.  The hysteroscope was then advanced into the uterine cavity noting the above findings.  Targeted curettage was carried out using a light Myosure  and the resulting specimen collected and sent to pathology.    The single tooth tenaculum was removed from the cervix.  The tenaculum sites and  cervix were noted to be  Hemostatic before removing the operative speculum.  Sponge needle and instrument counts were corrects times two.  The patient tolerated the procedure well and was taken to the recovery room in stable condition.

## 2019-06-24 NOTE — Anesthesia Preprocedure Evaluation (Signed)
Anesthesia Evaluation  Patient identified by MRN, date of birth, ID band Patient awake    Reviewed: Allergy & Precautions, H&P , NPO status , Patient's Chart, lab work & pertinent test results  Airway Mallampati: II  TM Distance: >3 FB Neck ROM: full    Dental  (+) Teeth Intact   Pulmonary asthma ,           Cardiovascular negative cardio ROS       Neuro/Psych negative neurological ROS  negative psych ROS   GI/Hepatic Neg liver ROS, GERD  Controlled and Medicated,  Endo/Other  negative endocrine ROS  Renal/GU      Musculoskeletal   Abdominal   Peds  Hematology negative hematology ROS (+)   Anesthesia Other Findings Past Medical History: No date: Allergic No date: Asthma No date: GERD (gastroesophageal reflux disease) No date: HSV (herpes simplex virus) infection No date: Immunization, viral disease     Comment:  Gardasil vaccine completed No date: TMJ (dislocation of temporomandibular joint)  Past Surgical History: 09/03/2017: ESOPHAGOGASTRODUODENOSCOPY (EGD) WITH PROPOFOL; N/A     Comment:  Procedure: ESOPHAGOGASTRODUODENOSCOPY (EGD) WITH               PROPOFOL;  Surgeon: Lin Landsman, MD;  Location:               [facility];  Service: Endoscopy;  Laterality:               N/A; No date: HERNIA REPAIR No date: MOLE REMOVAL No date: TONSILLECTOMY  BMI    Body Mass Index: 27.28 kg/m      Reproductive/Obstetrics negative OB ROS                             Anesthesia Physical Anesthesia Plan  ASA: II  Anesthesia Plan: General LMA   Post-op Pain Management:    Induction:   PONV Risk Score and Plan: Dexamethasone, Ondansetron, Midazolam and Treatment may vary due to age or medical condition  Airway Management Planned:   Additional Equipment:   Intra-op Plan:   Post-operative Plan:   Informed Consent: I have reviewed the patients History and  Physical, chart, labs and discussed the procedure including the risks, benefits and alternatives for the proposed anesthesia with the patient or authorized representative who has indicated his/her understanding and acceptance.     Dental Advisory Given  Plan Discussed with: Anesthesiologist and CRNA  Anesthesia Plan Comments:         Anesthesia Quick Evaluation

## 2019-06-24 NOTE — Transfer of Care (Signed)
Immediate Anesthesia Transfer of Care Note  Patient: Erika Moore  Procedure(s) Performed: DILATATION AND CURETTAGE /HYSTEROSCOPY (N/A )  Patient Location: PACU  Anesthesia Type:General  Level of Consciousness: awake, oriented and patient cooperative  Airway & Oxygen Therapy: Patient Spontanous Breathing  Post-op Assessment: Report given to RN, Post -op Vital signs reviewed and stable and Patient moving all extremities  Post vital signs: Reviewed and stable  Last Vitals:  Vitals Value Taken Time  BP 117/76 06/24/19 1236  Temp 36.4 C 06/24/19 1236  Pulse 93 06/24/19 1244  Resp 17 06/24/19 1244  SpO2 100 % 06/24/19 1244  Vitals shown include unvalidated device data.  Last Pain:  Vitals:   06/24/19 1236  TempSrc:   PainSc: 0-No pain         Complications: No apparent anesthesia complications

## 2019-06-24 NOTE — Discharge Instructions (Signed)

## 2019-06-24 NOTE — Anesthesia Procedure Notes (Signed)
Procedure Name: LMA Insertion Date/Time: 06/24/2019 11:34 AM Performed by: Lowry Bowl, CRNA Pre-anesthesia Checklist: Patient identified, Emergency Drugs available, Suction available and Patient being monitored Patient Re-evaluated:Patient Re-evaluated prior to induction Oxygen Delivery Method: Circle system utilized Preoxygenation: Pre-oxygenation with 100% oxygen Induction Type: IV induction Ventilation: Mask ventilation without difficulty LMA: LMA inserted LMA Size: 4.5 Number of attempts: 1 Placement Confirmation: positive ETCO2 and breath sounds checked- equal and bilateral Tube secured with: Tape Dental Injury: Teeth and Oropharynx as per pre-operative assessment

## 2019-06-25 ENCOUNTER — Encounter: Payer: Self-pay | Admitting: Obstetrics and Gynecology

## 2019-06-28 LAB — SURGICAL PATHOLOGY

## 2019-06-29 ENCOUNTER — Ambulatory Visit (INDEPENDENT_AMBULATORY_CARE_PROVIDER_SITE_OTHER): Payer: 59 | Admitting: Obstetrics and Gynecology

## 2019-06-29 ENCOUNTER — Other Ambulatory Visit: Payer: Self-pay

## 2019-06-29 ENCOUNTER — Encounter: Payer: Self-pay | Admitting: Obstetrics and Gynecology

## 2019-06-29 VITALS — BP 122/86 | HR 74 | Wt 154.0 lb

## 2019-06-29 DIAGNOSIS — Z3169 Encounter for other general counseling and advice on procreation: Secondary | ICD-10-CM

## 2019-06-29 DIAGNOSIS — Z31438 Encounter for other genetic testing of female for procreative management: Secondary | ICD-10-CM

## 2019-06-29 DIAGNOSIS — Z4889 Encounter for other specified surgical aftercare: Secondary | ICD-10-CM

## 2019-06-29 NOTE — Progress Notes (Signed)
      Postoperative Follow-up Patient presents post op from hysteroscopy D&C 1weeks ago for intermenstrual spotting secondary to endometrial polyp.  Subjective: Patient reports marked improvement in her preop symptoms. Eating a regular diet without difficulty. The patient is not having any pain.  Activity: normal activities of daily living. Reports cessation of bleeding  Objective: Blood pressure 122/86, pulse 74, weight 154 lb (69.9 kg), last menstrual period 06/28/2019.  General: NAD Pulmonary: no increased work of breathing Extremities: no edema Neurologic: normal gait    Admission on 06/24/2019, Discharged on 06/24/2019  Component Date Value Ref Range Status  . Preg Test, Ur 06/24/2019 NEGATIVE  NEGATIVE Final   Comment:        THE SENSITIVITY OF THIS METHODOLOGY IS >24 mIU/mL   . SURGICAL PATHOLOGY 06/24/2019    Final                   Value:Surgical Pathology CASE: [id number] PATIENT: Erika Moore Surgical Pathology Report     SPECIMEN SUBMITTED: A. Endometrial polyps  CLINICAL HISTORY: None provided  PRE-OPERATIVE DIAGNOSIS: Submucosal fibroid, abnormal uterine bleeding  POST-OPERATIVE DIAGNOSIS: Same as pre-op     DIAGNOSIS: A. ENDOMETRIAL POLYP, DILATATION AND CURETTAGE: - FEATURES SUGGESTIVE OF ENDOMETRIAL POLYP. - CHANGES CONSISTENT WITH EXOGENOUS HORMONE EFFECTS. - NEGATIVE FOR ATYPIA / EIN AND MALIGNANCY.   GROSS DESCRIPTION: A. Labeled: Endometrial polyp Received: In formalin Tissue fragment(s): Multiple Size: Aggregate cm Description: Multiple hemorrhagic tissue fragments Entirely submitted in 1 cassette.    Final Diagnosis performed by Betsy Pries, MD.   Electronically signed 06/28/2019 12:04:31PM The electronic signature indicates that the named Attending Pathologist has evaluated the specimen  Technical component performed at [facility], [address] [place], [place] [address] Lab: [phone] Dir:  Rush Farmer, MD, MMM  Professional component performed at [facility], [facility], [place], [place],  [address] Lab: [phone] Dir: Dellia Nims. Rubinas, MD     Assessment: 33 y.o. s/p hysteroscopy D&C stable  Plan: Patient has done well after surgery with no apparent complications.  I have discussed the post-operative course to date, and the expected progress moving forward.  The patient understands what complications to be concerned about.  I will see the patient in routine follow up, or sooner if needed.    Activity plan: No restriction.  Patient and her husband are interested in attempting to conceive within the next year.  Discussed carrier testing preconception at prior visit.  Obtained today    Malachy Mood, MD, [facility], [facility] 06/29/2019, 10:33 AM

## 2019-07-16 LAB — INHERITEST CORE(CF97,SMA,FRAX)

## 2019-07-29 DIAGNOSIS — Z803 Family history of malignant neoplasm of breast: Secondary | ICD-10-CM

## 2019-07-29 HISTORY — DX: Family history of malignant neoplasm of breast: Z80.3

## 2019-08-05 DIAGNOSIS — Z86018 Personal history of other benign neoplasm: Secondary | ICD-10-CM | POA: Diagnosis not present

## 2019-08-05 DIAGNOSIS — L578 Other skin changes due to chronic exposure to nonionizing radiation: Secondary | ICD-10-CM | POA: Diagnosis not present

## 2019-08-05 DIAGNOSIS — L918 Other hypertrophic disorders of the skin: Secondary | ICD-10-CM | POA: Diagnosis not present

## 2019-08-09 ENCOUNTER — Ambulatory Visit: Payer: 59

## 2019-08-09 ENCOUNTER — Telehealth: Payer: Self-pay

## 2019-08-09 NOTE — Telephone Encounter (Signed)
Called pt regarding appt at 2pm today for a myriad draw.  Pt states she had called and scheduled b/c there are diff cancers in her family.  Adv pt we did not have an order for it and AMS is on vac today.  Adv AMS will be in the office tomorrow.  SP to call pt to reschedule myriad draw.   

## 2019-08-10 ENCOUNTER — Other Ambulatory Visit: Payer: Self-pay | Admitting: Obstetrics and Gynecology

## 2019-08-10 NOTE — Telephone Encounter (Signed)
Patient is schedule for Thursday, 08/12/19

## 2019-08-10 NOTE — Telephone Encounter (Signed)
It a paper order isn't it?  I do not have an order for [facility]

## 2019-08-12 ENCOUNTER — Other Ambulatory Visit: Payer: Self-pay | Admitting: Obstetrics and Gynecology

## 2019-08-12 ENCOUNTER — Ambulatory Visit: Payer: 59

## 2019-08-12 ENCOUNTER — Other Ambulatory Visit: Payer: Self-pay

## 2019-08-12 DIAGNOSIS — Z808 Family history of malignant neoplasm of other organs or systems: Secondary | ICD-10-CM | POA: Diagnosis not present

## 2019-08-12 DIAGNOSIS — Z803 Family history of malignant neoplasm of breast: Secondary | ICD-10-CM

## 2019-08-12 DIAGNOSIS — Z800 Family history of malignant neoplasm of digestive organs: Secondary | ICD-10-CM | POA: Diagnosis not present

## 2019-08-12 DIAGNOSIS — Z8041 Family history of malignant neoplasm of ovary: Secondary | ICD-10-CM | POA: Diagnosis not present

## 2019-08-12 NOTE — Progress Notes (Signed)
Pt came to office for [facility] cancer genetic testing due to [place].

## 2019-08-29 DIAGNOSIS — Z9189 Other specified personal risk factors, not elsewhere classified: Secondary | ICD-10-CM

## 2019-08-29 DIAGNOSIS — Z1371 Encounter for nonprocreative screening for genetic disease carrier status: Secondary | ICD-10-CM

## 2019-08-29 DIAGNOSIS — Z8041 Family history of malignant neoplasm of ovary: Secondary | ICD-10-CM

## 2019-08-29 HISTORY — DX: Other specified personal risk factors, not elsewhere classified: Z91.89

## 2019-08-29 HISTORY — DX: Family history of malignant neoplasm of ovary: Z80.41

## 2019-08-29 HISTORY — DX: Encounter for nonprocreative screening for genetic disease carrier status: Z13.71

## 2019-09-06 ENCOUNTER — Encounter: Payer: Self-pay | Admitting: Obstetrics and Gynecology

## 2019-10-12 ENCOUNTER — Encounter: Payer: Self-pay | Admitting: Obstetrics and Gynecology

## 2019-10-12 ENCOUNTER — Other Ambulatory Visit: Payer: 59

## 2019-10-12 ENCOUNTER — Other Ambulatory Visit: Payer: Self-pay

## 2019-10-12 ENCOUNTER — Other Ambulatory Visit: Payer: Self-pay | Admitting: Obstetrics and Gynecology

## 2019-10-12 DIAGNOSIS — O3680X0 Pregnancy with inconclusive fetal viability, not applicable or unspecified: Secondary | ICD-10-CM

## 2019-10-12 DIAGNOSIS — Z1371 Encounter for nonprocreative screening for genetic disease carrier status: Secondary | ICD-10-CM | POA: Insufficient documentation

## 2019-10-12 NOTE — Progress Notes (Signed)
hcg 

## 2019-10-12 NOTE — Telephone Encounter (Signed)
Patient is schedule 10/12/19 and 10/14/19 for labs

## 2019-10-12 NOTE — Telephone Encounter (Signed)
HCG and progeserone labs today 12/15, repeat HCG on Thursday 12/17

## 2019-10-13 LAB — BETA HCG QUANT (REF LAB): hCG Quant: 597 m[IU]/mL

## 2019-10-13 LAB — PROGESTERONE: Progesterone: 22.8 ng/mL

## 2019-10-14 ENCOUNTER — Other Ambulatory Visit: Payer: 59

## 2019-10-14 ENCOUNTER — Other Ambulatory Visit: Payer: Self-pay

## 2019-10-14 DIAGNOSIS — O3680X0 Pregnancy with inconclusive fetal viability, not applicable or unspecified: Secondary | ICD-10-CM | POA: Diagnosis not present

## 2019-10-15 ENCOUNTER — Telehealth: Payer: Self-pay | Admitting: Obstetrics and Gynecology

## 2019-10-15 LAB — BETA HCG QUANT (REF LAB): hCG Quant: 1143 m[IU]/mL

## 2019-10-15 NOTE — Progress Notes (Signed)
If availability I told patient we'd see if we can move NOB to week of 12/28

## 2019-10-15 NOTE — Progress Notes (Signed)
No available appointments sooner than scheduled at this time.  Will reach out to patient if anything becomes available.

## 2019-10-15 NOTE — Telephone Encounter (Signed)
-----   Message from Malachy Mood, MD sent at 10/15/2019  8:11 AM EST ----- If availability I told patient we'd see if we can move NOB to week of 12/28

## 2019-10-15 NOTE — Telephone Encounter (Signed)
Called and spoke with patient about availability. Patient aware on cancellation list if anything comes open will be contacted

## 2019-10-17 DIAGNOSIS — Z20828 Contact with and (suspected) exposure to other viral communicable diseases: Secondary | ICD-10-CM | POA: Diagnosis not present

## 2019-10-29 NOTE — L&D Delivery Note (Signed)
Delivery Note At 9:48 AM a viable female was delivered via Vaginal, Spontaneous (Presentation: Right Occiput Anterior).  APGAR: 8, 9; weight  .   Placenta status: Spontaneous, Intact.  Cord: 3 vessels with the following complications: Short cord, nuchal cord x 1 reduced onperineum.  Cord pH: pending  Anesthesia: Epidural Episiotomy:  none Lacerations:  1st degree Suture Repair: none Est. Blood Loss (mL): 243mL  Mom to postpartum.  Baby to Couplet care / Skin to Skin.  Malachy Mood 06/23/2020, 10:07 AM

## 2019-11-01 ENCOUNTER — Other Ambulatory Visit: Payer: Self-pay

## 2019-11-01 ENCOUNTER — Encounter

## 2019-11-01 ENCOUNTER — Ambulatory Visit (INDEPENDENT_AMBULATORY_CARE_PROVIDER_SITE_OTHER): Payer: BC Managed Care – PPO | Admitting: Obstetrics and Gynecology

## 2019-11-01 ENCOUNTER — Other Ambulatory Visit (HOSPITAL_COMMUNITY)
Admission: RE | Admit: 2019-11-01 | Discharge: 2019-11-01 | Disposition: A | Discharge status: Home / Self Care | Payer: BC Managed Care – PPO | Source: Ambulatory Visit | Attending: Obstetrics and Gynecology | Admitting: Obstetrics and Gynecology

## 2019-11-01 ENCOUNTER — Encounter: Payer: Self-pay | Admitting: Obstetrics and Gynecology

## 2019-11-01 VITALS — BP 120/80 | Wt 154.0 lb

## 2019-11-01 DIAGNOSIS — Z3480 Encounter for supervision of other normal pregnancy, unspecified trimester: Secondary | ICD-10-CM | POA: Diagnosis present

## 2019-11-01 DIAGNOSIS — Z3481 Encounter for supervision of other normal pregnancy, first trimester: Secondary | ICD-10-CM

## 2019-11-01 DIAGNOSIS — Z3A01 Less than 8 weeks gestation of pregnancy: Secondary | ICD-10-CM

## 2019-11-01 DIAGNOSIS — Z3689 Encounter for other specified antenatal screening: Secondary | ICD-10-CM

## 2019-11-01 LAB — POCT URINALYSIS DIPSTICK OB
Glucose, UA: NEGATIVE
POC,PROTEIN,UA: NEGATIVE

## 2019-11-01 NOTE — Patient Instructions (Signed)
OVER THE COUNTER MEDICINES  SAFE IN PREGNANCY   COMPLAINT                                        MEDICINE                                                 Constipation Add fiber supplements such as Metamucil, Fibercon, or Citrucel.  Increasing fiber intake via bran cereals, oatmeal, leafy greens, and prunes is another alternative.  Increase you fluid intake.  You may also add Colace 100mg  by mouth twice daily or Senakot   Cough/Cold      Robitusin, Mucinex  Cuts and  Scrapes Bacitracin, Neosporin, Plysporin  Dental Pain     Tylenol. Avoid aspirin  products, which  could cause bleeding problems for mother and baby.  Also avoid ibuprofen products, such as Advil, Motrin or Aleve unless your doctor or nurse specifically recommends these drugs.  Diarrhea Immodium, Kaopectate, or Donnagel PG .  Ensure adequate hydration by taking in plenty of clear liquids such as Gatorade, ginger ale, and jello.  Call if symptoms persist over 24-hrs.  Do NOT take Pepto-Bismol  Fever Take Tylenol *, Extra-Strength Tylenol *, or any aspirin-free pain reliever (acetaminophen).  Avoid aspirin products, which could cause bleeding problems for mother and baby.  Also avoid ibuprofen products, such as Advil, Motrin or Aleve unless your doctor or nurse specifically recommends these drugs.  If you temperature is over 101o F (38.3o C) call the  clinic  Gas    Gaviscon,, Mylicon,  Riopan  Headache   Tylenol. Avoid aspirin                                                                                products, which  could cause bleeding problems for mother and baby.  Also avoid ibuprofen products, such as Advil, Motrin or Aleve unless your doctor or nurse specifically recommends these drugs.  Head Lice     Nix  Heartburn/Indigestion TUMS, Rolaids, Zantac, Mylanta. Maalox   Avoid fatty, fried, or spicy foods.  Eat small frequent meals 5-6 times daily as opposed to 3 large meals  a day.  Hemorrhoids Annusol HC, Tucks , Perperation H , or Dermaplast.  Warm sitz baths for 30 minutes twice daily.  Air dry and sleep without underwear.  Avoid constipation and see recommendation above for constipation as this may exacerbate/worsen you hemorrhoids  Leg Cramps     TUMS, Vitamin with Potassium  Leg Swelling  Elevate legs above  waist, lay on your left side.  Well fitting  shoes and support  hose  Muscle Aches                Tylenol. Avoid aspirin products, which  could cause bleeding problems for mother and baby.  Also avoid ibuprofen products, such as Advil, Motrin or Aleve unless your doctor or nurse specifically recommends these drugs.  Nausea/Vomiting Emetrol.  Supportive measures to ensure you stay adequately hydrated.  Sips of juice, Gatorade, ginger ale 4 times an hour.  If you are unable to tolerate fluid intake for greater than 8hrs call the clinic.  You may want to allow ginger ale to go flat as carbonation my precipitate emesis.  Over the counter chewable prenatal vitamins are available.  Vitamin B6 (pyridoxine) 10 to 24mg  every 8hrs and doxylamine (Unisome sleep taps) 25mg  at bedtime and 12.5mg  in the morning is first line.  Note that B6 supplements are not FDA regulated, there is a prescription of the above combination commercially available called Diclegis.  Nosebleeds Apply ice pack to nose.  Particularly in winter time with dryer air consider the use of a humidifier   Painful urination                 Call clinic  Rash/ Itch Benadryl, Aveeno, Cortaid.  For severe whole body itching in the late second early third trimester call the office  Sleep Problems     Benadryl, Tylenol  PM, or Unisom  Seasonal Allergies    Claritin, Zyrtec,           Benadryl, Mucinex  Sinus Congestion Tylenol with Sudafed (pseudoephedrine no phenylephrine) or  Actifed.  Sore Throat Chloroseptic lozenges and sprays such as Cepacol.  Salt water gargles (1 teaspoon of table salt in one quart of water)  Call if associated temperature above 101 Fahrenheit or 38.3 Celsius  Yeast Infection                Monistat

## 2019-11-01 NOTE — Progress Notes (Signed)
New Obstetric Patient H&P    Chief Complaint: "Desires prenatal care"   History of Present Illness: Patient is a 34 y.o. G1P0000 Not Hispanic or Latino female, presents with amenorrhea and positive home pregnancy test. Patient's last menstrual period was 09/17/2019. and based on her  LMP, her EDD is Estimated Date of Delivery: 06/23/20 and her EGA is [redacted]w[redacted]d Cycles are regular. Her last pap smear was normal on 08/18/2017.    She had a urine pregnancy test which was positive 2 week(s)  ago. Her last menstrual period was normal. Since her LMP she claims she has experienced some fatigue. She denies vaginal bleeding. Her past medical history is noncontributory. This is her first pregnancy.  Since her LMP, she admits to the use of tobacco products  no There are cats in the home in the home  no She admits close contact with children on a regular basis  no  She has had chicken pox in the past yes She has had Tuberculosis exposures, symptoms, or previously tested positive for TB   no Current or past history of domestic violence. no  Genetic Screening/Teratology Counseling: (Includes patient, baby's father, or anyone in either family with:)   [facility] 2. Thalassemia ([place] or [place] background): MCV<80  no 3. Neural tube defect (meningomyelocele, spina bifida, anencephaly)  no 4. Congenital heart defect  no  5. Down syndrome  no 6. Tay-Sachs (Jewish, [place]  no 7. Canavan's Disease  no 8. Sickle cell disease or trait (African)  no  9. Hemophilia or other blood disorders  no  10. Muscular dystrophy  no  11. Cystic fibrosis  no  12. Huntington's Chorea  no  13. Mental retardation/autism  no 14. Other inherited genetic or chromosomal disorder  no 15. Maternal metabolic disorder (DM, PKU, etc)  no 16. Patient or FOB with a child with a birth defect not listed above no  16a. Patient or FOB with a birth defect themselves no 17.  Recurrent pregnancy loss, or stillbirth  no  18. Any medications since LMP other than prenatal vitamins (include vitamins, supplements, OTC meds, drugs, alcohol)  no 19. Any other genetic/environmental exposure to discuss  no  Infection History:   1. Lives with someone with TB or TB exposed  no  2. Patient or partner has history of genital herpes  no 3. Rash or viral illness since LMP  no 4. History of STI (GC, CT, HPV, syphilis, HIV)  no 5. History of recent travel :  no  Other pertinent information:  no     Review of Systems:10 point review of systems negative unless otherwise noted in HPI  Past Medical History:  Past Medical History:  Diagnosis Date  . Allergic   . Asthma   . BRCA negative 08/2019   MyRisk neg  . Family history of breast cancer 07/2019  . Family history of ovarian cancer 08/2019   MyRisk neg  . GERD (gastroesophageal reflux disease)   . HSV (herpes simplex virus) infection   . Immunization, viral disease    Gardasil vaccine completed  . Increased risk of breast cancer 08/2019   IBIS=20.2%/riskscore=16.5%  . TMJ (dislocation of temporomandibular joint)     Past Surgical History:  Past Surgical History:  Procedure Laterality Date  . ESOPHAGOGASTRODUODENOSCOPY (EGD) WITH PROPOFOL N/A 09/03/2017   Procedure: ESOPHAGOGASTRODUODENOSCOPY (EGD) WITH PROPOFOL;  Surgeon: VLin Landsman MD;  [place]  Service: Endoscopy;  Laterality: N/A;  . HERNIA REPAIR    . HYSTEROSCOPY WITH D & C N/A 06/24/2019   Procedure: DILATATION AND CURETTAGE /HYSTEROSCOPY;  Surgeon: Malachy Mood, MD;  Location: [facility];  Service: Gynecology;  Laterality: N/A;  . MOLE REMOVAL    . TONSILLECTOMY      Gynecologic History: Patient's last menstrual period was 09/17/2019.  Obstetric History: G1P0000  Family History:  Family History  Problem Relation Age of Onset  . GER disease Mother   . Irritable bowel syndrome Mother   . Hypertension Mother   .  Pancreatic cancer Maternal Grandmother 4  . Ovarian cancer Paternal Grandmother 11  . Leukemia Paternal Grandfather   . Breast cancer Paternal Aunt 55       Genetic test negative    Social History:  Social History   Socioeconomic History  . Marital status: Married    Spouse name: Not on file  . Number of children: Not on file  . Years of education: Not on file  . Highest education level: Not on file  Occupational History  . Not on file  Tobacco Use  . Smoking status: Never Smoker  . Smokeless tobacco: Never Used  Substance and Sexual Activity  . Alcohol use: Yes    Comment: occasional  . Drug use: No  . Sexual activity: Yes  Other Topics Concern  . Not on file  Social History Narrative  . Not on file   Social Determinants of Health   Financial Resource Strain:   . Difficulty of Paying Living Expenses: Not on file  Food Insecurity:   . Worried About Charity fundraiser in the Last Year: Not on file  . Ran Out of Food in the Last Year: Not on file  Transportation Needs:   . Lack of Transportation (Medical): Not on file  . Lack of Transportation (Non-Medical): Not on file  Physical Activity:   . Days of Exercise per Week: Not on file  . Minutes of Exercise per Session: Not on file  Stress:   . Feeling of Stress : Not on file  Social Connections:   . Frequency of Communication with Friends and Family: Not on file  . Frequency of Social Gatherings with Friends and Family: Not on file  . Attends Religious Services: Not on file  . Active Member of Clubs or Organizations: Not on file  . Attends Archivist Meetings: Not on file  . Marital Status: Not on file  Intimate Partner Violence:   . Fear of Current or Ex-Partner: Not on file  . Emotionally Abused: Not on file  . Physically Abused: Not on file  . Sexually Abused: Not on file    Allergies:  Allergies  Allergen Reactions  . Other Itching    WALNUTS  . Sulfa Antibiotics Hives     Medications: Prior to Admission medications   Medication Sig Start Date End Date Taking? Authorizing Provider  albuterol (VENTOLIN HFA) 108 (90 Base) MCG/ACT inhaler Inhale 1-2 puffs into the lungs every 4 (four) hours as needed for wheezing or shortness of breath.    [provider]  esomeprazole (NEXIUM) 20 MG capsule Take 20 mg by mouth every morning.     [provider]  montelukast (SINGULAIR) 10 MG tablet Take 10 mg by mouth at bedtime.  07/02/18   [provider]  Multiple Vitamins-Minerals (MULTIVITAMIN WITH MINERALS) tablet Take 1 tablet by mouth daily.    [provider]  TRI-LO-SPRINTEC 0.18/0.215/0.25 MG-25 MCG tab  Take 1 tablet by mouth daily. 09/16/18   Malachy Mood, MD  triamcinolone (NASACORT ALLERGY 24HR) 55 MCG/ACT AERO nasal inhaler Place 2 sprays into the nose daily as needed (allergies).     [provider]  valACYclovir (VALTREX) 500 MG tablet TAKE 1 TABLET BY MOUTH TWICE A DAY Patient taking differently: Take 500 mg by mouth 2 (two) times daily as needed (cold sores).  11/19/18   Malachy Mood, MD    Physical Exam Vitals: Blood pressure 120/80, weight 154 lb (69.9 kg), last menstrual period 09/17/2019.  General: NAD HEENT: normocephalic, anicteric Thyroid: no enlargement, no palpable nodules Pulmonary: No increased work of breathing, CTAB Cardiovascular: RRR, distal pulses 2+ Abdomen: NABS, soft, non-tender, non-distended.  Umbilicus without lesions.  No hepatomegaly, splenomegaly or masses palpable. No evidence of hernia  Genitourinary:  External: Normal external female genitalia.  Normal urethral meatus, normal  Bartholin's and Skene's glands.    Vagina: Normal vaginal mucosa, no evidence of prolapse.    Cervix: Grossly normal in appearance, no bleeding  Uterus:  Non-enlarged, mobile, normal contour.  No CMT  Adnexa: ovaries non-enlarged, no adnexal masses  Rectal: deferred Extremities: no edema, erythema,  or tenderness Neurologic: Grossly intact Psychiatric: mood appropriate, affect full   Assessment: 34 y.o. G1P0000 at 43w3dpresenting to initiate prenatal care  Plan: 1) Avoid alcoholic beverages. 2) Patient encouraged not to smoke.  3) Discontinue the use of all non-medicinal drugs and chemicals.  4) Take prenatal vitamins daily.  5) Nutrition, food safety (fish, cheese advisories, and high nitrite foods) and exercise discussed. 6) Hospital and practice style discussed with cross coverage system.  7) Genetic Screening, such as with 1st Trimester Screening, cell free fetal DNA, AFP testing, and Ultrasound, as well as with amniocentesis and CVS as appropriate, is discussed with patient. At the conclusion of today's visit patient requested genetic testing   AMalachy Mood MD, [facility] 11/01/2019, 2:59 PM

## 2019-11-02 LAB — RPR+RH+ABO+RUB AB+AB SCR+CB...
Antibody Screen: NEGATIVE
HIV Screen 4th Generation wRfx: NONREACTIVE
Hematocrit: 35.6 % (ref 34.0–46.6)
Hemoglobin: 12.5 g/dL (ref 11.1–15.9)
Hepatitis B Surface Ag: NEGATIVE
MCH: 29.8 pg (ref 26.6–33.0)
MCHC: 35.1 g/dL (ref 31.5–35.7)
MCV: 85 fL (ref 79–97)
Platelets: 248 10*3/uL (ref 150–450)
RBC: 4.19 x10E6/uL (ref 3.77–5.28)
RDW: 14.8 % (ref 11.7–15.4)
RPR Ser Ql: NONREACTIVE
Rh Factor: POSITIVE
Rubella Antibodies, IGG: 2.89 index (ref >0.99)
Varicella zoster IgG: 304 index (ref >165)
WBC: 9.7 10*3/uL (ref 3.4–10.8)

## 2019-11-03 LAB — CERVICOVAGINAL ANCILLARY ONLY
Chlamydia: NEGATIVE
Comment: NEGATIVE
Comment: NORMAL
Neisseria Gonorrhea: NEGATIVE

## 2019-11-03 LAB — URINE CULTURE

## 2019-11-05 DIAGNOSIS — Z3480 Encounter for supervision of other normal pregnancy, unspecified trimester: Secondary | ICD-10-CM | POA: Insufficient documentation

## 2019-11-15 ENCOUNTER — Other Ambulatory Visit: Payer: Self-pay

## 2019-11-15 ENCOUNTER — Ambulatory Visit (INDEPENDENT_AMBULATORY_CARE_PROVIDER_SITE_OTHER): Payer: BC Managed Care – PPO | Admitting: Obstetrics & Gynecology

## 2019-11-15 ENCOUNTER — Ambulatory Visit (INDEPENDENT_AMBULATORY_CARE_PROVIDER_SITE_OTHER): Payer: BC Managed Care – PPO

## 2019-11-15 ENCOUNTER — Encounter: Payer: Self-pay | Admitting: Obstetrics & Gynecology

## 2019-11-15 VITALS — BP 120/80 | Wt 153.0 lb

## 2019-11-15 DIAGNOSIS — Z3689 Encounter for other specified antenatal screening: Secondary | ICD-10-CM

## 2019-11-15 DIAGNOSIS — Z3480 Encounter for supervision of other normal pregnancy, unspecified trimester: Secondary | ICD-10-CM

## 2019-11-15 DIAGNOSIS — Z3687 Encounter for antenatal screening for uncertain dates: Secondary | ICD-10-CM | POA: Diagnosis not present

## 2019-11-15 DIAGNOSIS — Z3481 Encounter for supervision of other normal pregnancy, first trimester: Secondary | ICD-10-CM

## 2019-11-15 DIAGNOSIS — Z3A08 8 weeks gestation of pregnancy: Secondary | ICD-10-CM

## 2019-11-15 NOTE — Progress Notes (Signed)
  Subjective  Fetal Movement? no Nausea? mild Pain? no Vaginal Bleeding? no  Objective  BP 120/80   Wt 153 lb (69.4 kg)   LMP 09/17/2019   BMI 27.10 kg/m  General: NAD Pumonary: no increased work of breathing Abdomen: gravid, non-tender Extremities: no edema Psychiatric: mood appropriate, affect full  Assessment  34 y.o. G1P0000 at [redacted]w[redacted]d by  06/23/2020, by Last Menstrual Period presenting for routine prenatal visit  Plan   Problem List Items Addressed This Visit      Other   Supervision of other normal pregnancy, antepartum    Other Visit Diagnoses    [redacted] weeks gestation of pregnancy    -  Primary      pregnancy 1  Problems (from 09/17/19 to present)    Problem Noted Resolved   Supervision of other normal pregnancy, antepartum 11/05/2019 by Malachy Mood, MD No   Overview Addendum 11/15/2019  3:12 PM by Gae Dry, MD    Clinic [facility] Prenatal Labs  Dating LMP Blood type: O/Positive/-- (01/04 1540)   Genetic Screen Fragile-X neg, SMA neg, CF neg NIPS: Antibody:Negative (01/04 1540)  Anatomic [place]  Rubella: 2.89 (01/04 1540) Varicella: Immune  GTT    Third trimester:  RPR: Non Reactive (01/04 1540)   Rhogam  HBsAg: Negative (01/04 1540)   TDaP vaccine                       Flu Shot:decl HIV: Non Reactive (01/04 1540)   Baby Food             Br                   GBS:   Contraception             Pill Pap:  CBB     CS/VBAC    Support Person              Review of ULTRASOUND.    I have personally reviewed images and report of recent ultrasound done at [facility].    Plan of management to be discussed with patient. PNV NIPT discussed, considering Declines flu  Barnett Applebaum, MD, [facility] 11/15/2019  3:29 PM

## 2019-11-15 NOTE — Patient Instructions (Signed)
Genetic Testing During Pregnancy Genetic testing during pregnancy is also called prenatal genetic testing. This type of testing can determine if your baby is at risk of being born with a disorder caused by abnormal genes or chromosomes (genetic disorder). Chromosomes contain genes that control how your baby will develop in your womb. There are many different genetic disorders. Examples of genetic disorders that may be found through genetic testing include Down syndrome and cystic fibrosis. Gene changes (mutations) can be passed down through families. Genetic testing is offered to all women before or during pregnancy. You can choose whether to have genetic testing. Why is genetic testing done? Genetic testing is done during pregnancy to find out whether your child is at risk for a genetic disorder. Having genetic testing allows you to:  Discuss your test results and options with a genetic counselor.  Prepare for a baby that may be born with a genetic disorder. Learning about the disorder ahead of time helps you be better prepared to manage it. Your health care providers can also be prepared in case your baby requires special care before or after birth.  Consider whether you want to continue with the pregnancy. In some cases, genetic testing may be done to learn about the traits a child will inherit. Types of genetic tests There are two basic types of genetic testing. Screening tests indicate whether your developing baby (fetus) is at higher risk for a genetic disorder. Diagnostic tests check actual fetal cells to diagnose a genetic disorder. Screening tests     Screening tests will not harm your baby. They are recommended for all pregnant women. Types of screening tests include:  Carrier screening. This test involves checking genes from both parents by testing their blood or saliva. The test checks to find out if the parents carry a genetic mutation that may be passed to a baby. In most cases,  both parents must carry the mutation for a baby to be at risk.  First trimester screening. This test combines a blood test with sound wave imaging of your baby (fetal ultrasound). This screening test checks for a risk of Down syndrome or other defects caused by having extra chromosomes. It also checks for defects of the heart, abdomen, or skeleton.  Second trimester screening also combines a blood test with a fetal ultrasound exam. It checks for a risk of genetic defects of the face, brain, spine, heart, or limbs.  Combined or sequential screening. This type of testing combines the results of first and second trimester screening. This type of testing may be more accurate than first or second trimester screening alone.  Cell-free DNA testing. This is a blood test that detects cells released by the placenta that get into the mother's blood. It can be used to check for a risk of Down syndrome, other extra chromosome syndromes, and disorders caused by abnormal numbers of sex chromosomes. This test can be done any time after 10 weeks of pregnancy.  Diagnostic tests Diagnostic tests carry slight risks of problems, including bleeding, infection, and loss of the pregnancy. These tests are done only if your baby is at risk for a genetic disorder. You may meet with a genetic counselor to discuss the risks and benefits before having diagnostic tests. Examples of diagnostic tests include:  Chorionic villus sampling (CVS). This involves a procedure to remove and test a sample of cells taken from the placenta. The procedure may be done between 10 and 12 weeks of pregnancy.  Amniocentesis. This involves a   procedure to remove and test a sample of fluid (amniotic fluid) and cells from the sac that surrounds the developing baby. The procedure may be done between 15 and 20 weeks of pregnancy. What do the results mean? For a screening test:  If the results are negative, it often means that your child is not at higher  risk. There is still a slight chance your child could have a genetic disorder.  If the results are positive, it does not mean your child will have a genetic disorder. It may mean that your child has a higher-than-normal risk for a genetic disorder. In that case, you may want to talk with a genetic counselor about whether you should have diagnostic genetic tests. For a diagnostic test:  If the result is negative, it is unlikely that your child will have a genetic disorder.  If the test is positive for a genetic disorder, it is likely that your child will have the disorder. The test may not tell how severe the disorder will be. Talk with your health care provider about your options. Questions to ask your health care provider Before talking to your health care provider about genetic testing, find out if there is a history of genetic disorders in your family. It may also help to know your family's ethnic origins. Then ask your health care provider the following questions:  Is my baby at risk for a genetic disorder?  What are the benefits of having genetic screening?  What tests are best for me and my baby?  What are the risks of each test?  If I get a positive result on a screening test, what is the next step?  Should I meet with a genetic counselor before having a diagnostic test?  Should my partner or other members of my family be tested?  How much do the tests cost? Will my insurance cover the testing? Summary  Genetic testing is done during pregnancy to find out whether your child is at risk for a genetic disorder.  Genetic testing is offered to all women before or during pregnancy. You can choose whether to have genetic testing.  There are two basic types of genetic testing. Screening tests indicate whether your developing baby (fetus) is at higher risk for a genetic disorder. Diagnostic tests check actual fetal cells to diagnose a genetic disorder.  If a diagnostic genetic test is  positive, talk with your health care provider about your options. This information is not intended to replace advice given to you by your health care provider. Make sure you discuss any questions you have with your health care provider. Document Revised: 02/04/2019 Document Reviewed: 12/29/2017 Elsevier Patient Education  2020 Elsevier Inc.  

## 2019-11-16 NOTE — Telephone Encounter (Signed)
Does she need anything done prior to having Materniti21 genetic labs done?

## 2019-11-25 ENCOUNTER — Other Ambulatory Visit: Payer: Self-pay

## 2019-11-25 ENCOUNTER — Other Ambulatory Visit: Payer: Self-pay | Admitting: Obstetrics and Gynecology

## 2019-11-25 ENCOUNTER — Ambulatory Visit: Payer: BC Managed Care – PPO

## 2019-11-25 DIAGNOSIS — Z3480 Encounter for supervision of other normal pregnancy, unspecified trimester: Secondary | ICD-10-CM

## 2019-11-25 DIAGNOSIS — M545 Low back pain, unspecified: Secondary | ICD-10-CM

## 2019-11-25 NOTE — Telephone Encounter (Signed)
Order is in.

## 2019-11-27 LAB — URINE CULTURE

## 2019-11-29 ENCOUNTER — Other Ambulatory Visit: Payer: Self-pay

## 2019-11-29 ENCOUNTER — Ambulatory Visit (INDEPENDENT_AMBULATORY_CARE_PROVIDER_SITE_OTHER): Payer: BC Managed Care – PPO | Admitting: Obstetrics and Gynecology

## 2019-11-29 VITALS — BP 124/62 | Wt 152.0 lb

## 2019-11-29 DIAGNOSIS — Z3480 Encounter for supervision of other normal pregnancy, unspecified trimester: Secondary | ICD-10-CM

## 2019-11-29 DIAGNOSIS — Z3A10 10 weeks gestation of pregnancy: Secondary | ICD-10-CM

## 2019-11-29 DIAGNOSIS — Z1379 Encounter for other screening for genetic and chromosomal anomalies: Secondary | ICD-10-CM

## 2019-11-29 NOTE — Progress Notes (Signed)
ROB

## 2019-11-29 NOTE — Progress Notes (Signed)
    Routine Prenatal Care Visit  Subjective  Erika Moore is a 34 y.o. G1P0000 at 71w3dbeing seen today for ongoing prenatal care.  She is currently monitored for the following issues for this high-risk pregnancy and has Allergic rhinitis; Allergy; Asthma, well controlled; Gastroesophageal reflux disease; BRCA negative; and Supervision of other normal pregnancy, antepartum on their problem list.  ----------------------------------------------------------------------------------- Patient reports no complaints.  Still having some nausea particular when taking prenatal vitamins Contractions: Not present. Vag. Bleeding: None.  Movement: Absent. Denies leaking of fluid.  ----------------------------------------------------------------------------------- The following portions of the patient's history were reviewed and updated as appropriate: allergies, current medications, past family history, past medical history, past social history, past surgical history and problem list. Problem list updated.   Objective  Blood pressure 124/62, weight 152 lb (68.9 kg), last menstrual period 09/17/2019. Pregravid weight 154 lb (69.9 kg) Total Weight Gain -2 lb (-0.907 kg) Urinalysis:      Fetal Status: Fetal Heart Rate (bpm): 166   Movement: Absent     General:  Alert, oriented and cooperative. Patient is in no acute distress.  Skin: Skin is warm and dry. No rash noted.   Cardiovascular: Normal heart rate noted  Respiratory: Normal respiratory effort, no problems with respiration noted  Abdomen: Soft, gravid, appropriate for gestational age. Pain/Pressure: Absent     Pelvic:  Cervical exam deferred        Extremities: Normal range of motion.     ental Status: Normal mood and affect. Normal behavior. Normal judgment and thought content.     Assessment   34y.o. G1P0000 at 122w3dy  06/23/2020, by Last Menstrual Period presenting for routine prenatal visit  Plan   pregnancy 1  Problems (from  09/17/19 to present)    Problem Noted Resolved   Supervision of other normal pregnancy, antepartum 11/05/2019 by StMalachy MoodMD No   Overview Addendum 11/15/2019  3:32 PM by HaGae DryMD    [facility]inic [facility] Prenatal Labs  Dating LMP Blood type: O/Positive/-- (01/04 1540)   Genetic Screen Fragile-X neg, SMA neg, CF neg NIPS: Antibody:Negative (01/04 1540)  Anatomic USKoreaRubella: 2.89 (01/04 1540) Varicella: Immune  GTT    Third trimester:  RPR: Non Reactive (01/04 1540)   Rhogam             n/a HBsAg: Negative (01/04 1540)   TDaP vaccine             Flu Shot: declines HIV: Non Reactive (01/04 1540)   Baby Food             Br                   GBS:   Contraception             Pill Pap:10/18 normal, needs pp  CBB              No   CS/VBAC             n/a   Support Person                Gestational age appropriate obstetric precautions including but not limited to vaginal bleeding, contractions, leaking of fluid and fetal movement were reviewed in detail with the patient.    Return in about 4 weeks (around 12/27/2019) for ROB.  AnMalachy MoodMD, FALockbourne[facility]/GYN, [facility] 11/29/2019, 2:43 PM

## 2019-12-04 LAB — MATERNIT 21 PLUS CORE, BLOOD
Fetal Fraction: 13
Result (T21): NEGATIVE
Trisomy 13 (Patau syndrome): NEGATIVE
Trisomy 18 (Edwards syndrome): NEGATIVE
Trisomy 21 (Down syndrome): NEGATIVE

## 2019-12-06 ENCOUNTER — Telehealth: Payer: Self-pay

## 2019-12-06 NOTE — Telephone Encounter (Signed)
FMLA/DISABILITY form for [employer] filled out, signature obtained and given to KT for processing.

## 2019-12-27 ENCOUNTER — Other Ambulatory Visit: Payer: Self-pay

## 2019-12-27 ENCOUNTER — Ambulatory Visit (INDEPENDENT_AMBULATORY_CARE_PROVIDER_SITE_OTHER): Payer: BC Managed Care – PPO | Admitting: Obstetrics and Gynecology

## 2019-12-27 VITALS — BP 112/70 | Wt 150.0 lb

## 2019-12-27 DIAGNOSIS — Z3402 Encounter for supervision of normal first pregnancy, second trimester: Secondary | ICD-10-CM

## 2019-12-27 DIAGNOSIS — Z363 Encounter for antenatal screening for malformations: Secondary | ICD-10-CM

## 2019-12-27 DIAGNOSIS — Z3A14 14 weeks gestation of pregnancy: Secondary | ICD-10-CM

## 2019-12-27 DIAGNOSIS — Z3480 Encounter for supervision of other normal pregnancy, unspecified trimester: Secondary | ICD-10-CM

## 2019-12-27 LAB — POCT URINALYSIS DIPSTICK OB
Glucose, UA: NEGATIVE
POC,PROTEIN,UA: NEGATIVE

## 2019-12-27 MED ORDER — VALACYCLOVIR HCL 500 MG PO TABS: 500.0000 mg | ORAL_TABLET | Freq: Two times a day (BID) | ORAL | 6 refills | Status: DC | PRN

## 2019-12-27 NOTE — Progress Notes (Signed)
ROB Refill Valtrex

## 2019-12-27 NOTE — Progress Notes (Signed)
    Routine Prenatal Care Visit  Subjective  Erika Moore is a 34 y.o. G1P0000 at 36w3dbeing seen today for ongoing prenatal care.  She is currently monitored for the following issues for this low-risk pregnancy and has Allergic rhinitis; Allergy; Asthma, well controlled; Gastroesophageal reflux disease; BRCA negative; and Supervision of other normal pregnancy, antepartum on their problem list.  ----------------------------------------------------------------------------------- Patient reports no complaints.  Has noted some worsening of headaches.  Nausea improved. Contractions: Not present. Vag. Bleeding: None.  Movement: Absent. Denies leaking of fluid.  ----------------------------------------------------------------------------------- The following portions of the patient's history were reviewed and updated as appropriate: allergies, current medications, past family history, past medical history, past social history, past surgical history and problem list. Problem list updated.   Objective  Blood pressure 112/70, weight 150 lb (68 kg), last menstrual period 09/17/2019. Pregravid weight 154 lb (69.9 kg) Total Weight Gain -4 lb (-1.814 kg) Urinalysis:      Fetal Status: Fetal Heart Rate (bpm): 140   Movement: Absent     General:  Alert, oriented and cooperative. Patient is in no acute distress.  Skin: Skin is warm and dry. No rash noted.   Cardiovascular: Normal heart rate noted  Respiratory: Normal respiratory effort, no problems with respiration noted  Abdomen: Soft, gravid, appropriate for gestational age. Pain/Pressure: Absent     Pelvic:  Cervical exam deferred        Extremities: Normal range of motion.     ental Status: Normal mood and affect. Normal behavior. Normal judgment and thought content.     Assessment   34y.o. G1P0000 at 156w3dy  06/23/2020, by Last Menstrual Period presenting for routine prenatal visit  Plan   pregnancy 1  Problems (from 09/17/19 to  present)    Problem Noted Resolved   Supervision of other normal pregnancy, antepartum 11/05/2019 by StMalachy MoodMD No   Overview Addendum 12/06/2019  3:41 PM by StMalachy MoodMD    [facility] Prenatal Labs  Dating LMP Blood type: O/Positive/-- (01/04 1540)   Genetic Screen Fragile-X neg, SMA neg, CF neg NIPS: Normal XX Antibody:Negative (01/04 1540)  Anatomic USKoreaRubella: 2.89 (01/04 1540) Varicella: Immune  GTT    Third trimester:  RPR: Non Reactive (01/04 1540)   Rhogam             n/a HBsAg: Negative (01/04 1540)   TDaP vaccine             Flu Shot: declines HIV: Non Reactive (01/04 1540)   Baby Food             Br                   GBS:   Contraception             Pill Pap:10/18 normal, needs pp  CBB              No   CS/VBAC             n/a   Support Person                Gestational age appropriate obstetric precautions including but not limited to vaginal bleeding, contractions, leaking of fluid and fetal movement were reviewed in detail with the patient.    No follow-ups on file.  AnMalachy MoodMD, [facility], [facility] 12/27/2019, 2:38 PM

## 2020-01-24 ENCOUNTER — Other Ambulatory Visit: Payer: Self-pay

## 2020-01-24 ENCOUNTER — Ambulatory Visit (INDEPENDENT_AMBULATORY_CARE_PROVIDER_SITE_OTHER): Payer: BC Managed Care – PPO

## 2020-01-24 ENCOUNTER — Ambulatory Visit (INDEPENDENT_AMBULATORY_CARE_PROVIDER_SITE_OTHER): Payer: BC Managed Care – PPO | Admitting: Obstetrics and Gynecology

## 2020-01-24 VITALS — BP 112/72 | Wt 154.0 lb

## 2020-01-24 DIAGNOSIS — Z3A18 18 weeks gestation of pregnancy: Secondary | ICD-10-CM | POA: Diagnosis not present

## 2020-01-24 DIAGNOSIS — Z363 Encounter for antenatal screening for malformations: Secondary | ICD-10-CM

## 2020-01-24 DIAGNOSIS — Z3480 Encounter for supervision of other normal pregnancy, unspecified trimester: Secondary | ICD-10-CM

## 2020-01-24 DIAGNOSIS — Z3402 Encounter for supervision of normal first pregnancy, second trimester: Secondary | ICD-10-CM

## 2020-01-24 LAB — POCT URINALYSIS DIPSTICK OB
Glucose, UA: NEGATIVE
POC,PROTEIN,UA: NEGATIVE

## 2020-01-24 NOTE — Progress Notes (Signed)
Routine Prenatal Care Visit  Subjective  Erika Moore is a 34 y.o. G1P0000 at 75w3dbeing seen today for ongoing prenatal care.  She is currently monitored for the following issues for this low-risk pregnancy and has Allergic rhinitis; Allergy; Asthma, well controlled; Gastroesophageal reflux disease; BRCA negative; and Supervision of other normal pregnancy, antepartum on their problem list.  ----------------------------------------------------------------------------------- Patient reports no complaints.   Contractions: Not present. Vag. Bleeding: None.  Movement: Absent. Denies leaking of fluid.  ----------------------------------------------------------------------------------- The following portions of the patient's history were reviewed and updated as appropriate: allergies, current medications, past family history, past medical history, past social history, past surgical history and problem list. Problem list updated.   Objective  Blood pressure 112/72, weight 154 lb (69.9 kg), last menstrual period 09/17/2019. Pregravid weight 154 lb (69.9 kg) Total Weight Gain 0 lb (0 kg) Urinalysis:      Fetal Status: Fetal Heart Rate (bpm): 145   Movement: Absent     General:  Alert, oriented and cooperative. Patient is in no acute distress.  Skin: Skin is warm and dry. No rash noted.   Cardiovascular: Normal heart rate noted  Respiratory: Normal respiratory effort, no problems with respiration noted  Abdomen: Soft, gravid, appropriate for gestational age. Pain/Pressure: Absent     Pelvic:  Cervical exam deferred        Extremities: Normal range of motion.     ental Status: Normal mood and affect. Normal behavior. Normal judgment and thought content.   UKoreaOB Comp + 14 Wk  Result Date: 01/24/2020 Patient Name: Erika FOFANADOB: 1Aug 28, 1987MRN: [id number] [facility]PORT [facility] Date of Service: 01/24/2020 Indications:Anatomy Ultrasound Findings: SNelda [place] intrauterine pregnancy is visualized with FHR at 142 BPM. Biometrics give an (U/S) Gestational age of 199w0dnd an (U/S) EDD of 06/19/2020; this correlates with the clinically established Estimated Date of Delivery: 06/23/20 Fetal presentation is Cephalic. EFW: 247 g ( 9 oz ). Placenta: anterior. Grade: 0 AFI: subjectively normal. Anatomic survey is complete and normal; Gender - female.  Impression: 1. 1810w3dable Singleton Intrauterine pregnancy by U/S. 2. (U/S) EDD is consistent with Clinically established Estimated Date of Delivery: 06/23/20 . 3. Normal Anatomy Scan Recommendations: 1.Clinical correlation with the patient's History and Physical Exam. ElyGweneth DimitriT  There is a singleton gestation with subjectively normal amniotic fluid volume. The fetal biometry correlates with established dating. Detailed evaluation of the fetal anatomy was performed.The fetal anatomical survey appears within normal limits within the resolution of ultrasound as described above.  It must be noted that a normal ultrasound is unable to rule out fetal aneuploidy, subtle defects such as small ASD or VDS may also not be visible on imaging.  AndMalachy MoodD, FACBroad Creek/[facility]N, [facility] 01/24/2020, 2:04 PM     Assessment   [age]o. G1P0000 at 18w5w3d 06/23/2020, by Last Menstrual Period presenting for routine prenatal visit  Plan   pregnancy 1  Problems (from 09/17/19 to present)    Problem Noted Resolved   Supervision of other normal pregnancy, antepartum 11/05/2019 by StaeMalachy Mood No   Overview Addendum 12/06/2019  3:41 PM by StaeMalachy Mood    [facility] Prenatal Labs  Dating LMP Blood type: O/Positive/-- (01/04 1540)   Genetic Screen Fragile-X neg, SMA neg, CF neg NIPS: Normal XX Antibody:Negative (01/04 1540)  Anatomic US  [place]: 2.89 (01/04 1540) Varicella: Immune  GTT    Third trimester:  RPR:  Non Reactive (01/04 1540)   Rhogam             n/a HBsAg: Negative  (01/04 1540)   TDaP vaccine             Flu Shot: declines HIV: Non Reactive (01/04 1540)   Baby Food             Br                   GBS:   Contraception             Pill Pap:10/18 normal, needs pp  CBB              No   CS/VBAC             n/a   Support Person                Gestational age appropriate obstetric precautions including but not limited to vaginal bleeding, contractions, leaking of fluid and fetal movement were reviewed in detail with the patient.    Return in about 4 weeks (around 02/21/2020) for ROB.  Malachy Mood, MD, [facility], [facility] 01/24/2020, 2:28 PM

## 2020-01-24 NOTE — Progress Notes (Signed)
ROB  °Anatomy scan °

## 2020-02-16 DIAGNOSIS — B009 Herpesviral infection, unspecified: Secondary | ICD-10-CM | POA: Insufficient documentation

## 2020-02-21 ENCOUNTER — Ambulatory Visit (INDEPENDENT_AMBULATORY_CARE_PROVIDER_SITE_OTHER): Payer: BC Managed Care – PPO | Admitting: Obstetrics and Gynecology

## 2020-02-21 ENCOUNTER — Other Ambulatory Visit: Payer: Self-pay

## 2020-02-21 VITALS — BP 102/66 | Wt 163.0 lb

## 2020-02-21 DIAGNOSIS — Z3A22 22 weeks gestation of pregnancy: Secondary | ICD-10-CM

## 2020-02-21 DIAGNOSIS — Z3482 Encounter for supervision of other normal pregnancy, second trimester: Secondary | ICD-10-CM

## 2020-02-21 DIAGNOSIS — Z3480 Encounter for supervision of other normal pregnancy, unspecified trimester: Secondary | ICD-10-CM

## 2020-02-21 LAB — POCT URINALYSIS DIPSTICK OB
Glucose, UA: NEGATIVE
POC,PROTEIN,UA: NEGATIVE

## 2020-02-21 NOTE — Progress Notes (Signed)
    Routine Prenatal Care Visit  Subjective  Erika Moore is a 34 y.o. G1P0000 at 40w3dbeing seen today for ongoing prenatal care.  She is currently monitored for the following issues for this low-risk pregnancy and has Allergic rhinitis; Allergy; Asthma, well controlled; Gastroesophageal reflux disease; BRCA negative; and Supervision of other normal pregnancy, antepartum on their problem list.  ----------------------------------------------------------------------------------- Patient reports no complaints.   Contractions: Not present. Vag. Bleeding: None.  Movement: Present. Denies leaking of fluid.  ----------------------------------------------------------------------------------- The following portions of the patient's history were reviewed and updated as appropriate: allergies, current medications, past family history, past medical history, past social history, past surgical history and problem list. Problem list updated.   Objective  Blood pressure 102/66, weight 163 lb (73.9 kg), last menstrual period 09/17/2019. Pregravid weight 154 lb (69.9 kg) Total Weight Gain 9 lb (4.082 kg) Urinalysis:      Fetal Status: Fetal Heart Rate (bpm): 145   Movement: Present     General:  Alert, oriented and cooperative. Patient is in no acute distress.  Skin: Skin is warm and dry. No rash noted.   Cardiovascular: Normal heart rate noted  Respiratory: Normal respiratory effort, no problems with respiration noted  Abdomen: Soft, gravid, appropriate for gestational age. Pain/Pressure: Absent     Pelvic:  Cervical exam deferred        Extremities: Normal range of motion.     ental Status: Normal mood and affect. Normal behavior. Normal judgment and thought content.     Assessment   34y.o. G1P0000 at 246w3dy  06/23/2020, by Last Menstrual Period presenting for routine prenatal visit  Plan   pregnancy 1  Problems (from 09/17/19 to present)    Problem Noted Resolved   Supervision of other  normal pregnancy, antepartum 11/05/2019 by StMalachy MoodMD No   Overview Addendum 01/24/2020  2:27 PM by StMalachy MoodMD    [facility] Prenatal Labs  Dating LMP = 9 week USKorealood type: O/Positive/-- (01/04 1540)   Genetic Screen Fragile-X neg, SMA neg, CF neg NIPS: Normal XX Antibody:Negative (01/04 1540)  Anatomic US[place]oreaormal Rubella: 2.89 (01/04 1540) Varicella: Immune  GTT    Third trimester:  RPR: Non Reactive (01/04 1540)   Rhogam             n/a HBsAg: Negative (01/04 1540)   TDaP vaccine             Flu Shot: declines HIV: Non Reactive (01/04 1540)   Baby Food             Br                   GBS:   Contraception             Pill Pap:10/18 normal, needs pp  CBB              No   CS/VBAC             n/a   Support Person                Gestational age appropriate obstetric precautions including but not limited to vaginal bleeding, contractions, leaking of fluid and fetal movement were reviewed in detail with the patient.    Return in about 4 weeks (around 03/20/2020) for ROB.  AnMalachy MoodMD, FALoura PardonB/GYN, [facility] 02/21/2020, 2:58 PM

## 2020-02-21 NOTE — Progress Notes (Signed)
ROB

## 2020-03-20 ENCOUNTER — Other Ambulatory Visit: Payer: Self-pay

## 2020-03-20 ENCOUNTER — Ambulatory Visit (INDEPENDENT_AMBULATORY_CARE_PROVIDER_SITE_OTHER): Payer: BC Managed Care – PPO | Admitting: Obstetrics and Gynecology

## 2020-03-20 VITALS — BP 124/76 | Wt 169.0 lb

## 2020-03-20 DIAGNOSIS — Z3480 Encounter for supervision of other normal pregnancy, unspecified trimester: Secondary | ICD-10-CM

## 2020-03-20 DIAGNOSIS — Z3A26 26 weeks gestation of pregnancy: Secondary | ICD-10-CM

## 2020-03-20 LAB — POCT URINALYSIS DIPSTICK OB
Glucose, UA: NEGATIVE
POC,PROTEIN,UA: NEGATIVE

## 2020-03-20 NOTE — Addendum Note (Signed)
Addended by: [place], Bria Sparr B on: 03/20/2020 02:28 PM   Modules accepted: Orders

## 2020-03-20 NOTE — Progress Notes (Signed)
ROB

## 2020-03-20 NOTE — Progress Notes (Signed)
    Routine Prenatal Care Visit  Subjective  Erika Moore is a 34 y.o. G1P0000 at 73w3dbeing seen today for ongoing prenatal care.  She is currently monitored for the following issues for this low-risk pregnancy and has Allergic rhinitis; Allergy; Asthma, well controlled; Gastroesophageal reflux disease; BRCA negative; and Supervision of other normal pregnancy, antepartum on their problem list.  ----------------------------------------------------------------------------------- Patient reports no complaints.   Contractions: Not present. Vag. Bleeding: None.  Movement: Present. Denies leaking of fluid.  ----------------------------------------------------------------------------------- The following portions of the patient's history were reviewed and updated as appropriate: allergies, current medications, past family history, past medical history, past social history, past surgical history and problem list. Problem list updated.   Objective  Blood pressure 124/76, weight 169 lb (76.7 kg), last menstrual period 09/17/2019. Pregravid weight 154 lb (69.9 kg) Total Weight Gain 15 lb (6.804 kg) Urinalysis:      Fetal Status: Fetal Heart Rate (bpm): 150 Fundal Height: 26 cm Movement: Present     General:  Alert, oriented and cooperative. Patient is in no acute distress.  Skin: Skin is warm and dry. No rash noted.   Cardiovascular: Normal heart rate noted  Respiratory: Normal respiratory effort, no problems with respiration noted  Abdomen: Soft, gravid, appropriate for gestational age. Pain/Pressure: Absent     Pelvic:  Cervical exam deferred        Extremities: Normal range of motion.     ental Status: Normal mood and affect. Normal behavior. Normal judgment and thought content.     Assessment   34y.o. G1P0000 at 242w3dy  06/23/2020, by Last Menstrual Period presenting for routine prenatal visit  Plan   pregnancy 1  Problems (from 09/17/19 to present)    Problem Noted Resolved   Supervision of other normal pregnancy, antepartum 11/05/2019 by StMalachy MoodMD No   Overview Addendum 01/24/2020  2:27 PM by StMalachy MoodMD    [facility] Prenatal Labs  Dating LMP = 9 week USKorealood type: O/Positive/-- (01/04 1540)   Genetic Screen Fragile-X neg, SMA neg, CF neg NIPS: Normal XX Antibody:Negative (01/04 1540)  Anatomic US[place]oreaormal Rubella: 2.89 (01/04 1540) Varicella: Immune  GTT    Third trimester:  RPR: Non Reactive (01/04 1540)   Rhogam             n/a HBsAg: Negative (01/04 1540)   TDaP vaccine             Flu Shot: declines HIV: Non Reactive (01/04 1540)   Baby Food             Br                   GBS:   Contraception             Pill Pap:10/18 normal, needs pp  CBB              No   CS/VBAC             n/a   Support Person                Gestational age appropriate obstetric precautions including but not limited to vaginal bleeding, contractions, leaking of fluid and fetal movement were reviewed in detail with the patient.    Return in about 2 weeks (around 04/03/2020) for ROB and 28 week labs.  AnMalachy MoodMD, FAHeplerB/GYN, [facility] 03/20/2020, 2:12 PM

## 2020-04-06 ENCOUNTER — Ambulatory Visit (INDEPENDENT_AMBULATORY_CARE_PROVIDER_SITE_OTHER): Payer: BC Managed Care – PPO | Admitting: Obstetrics and Gynecology

## 2020-04-06 ENCOUNTER — Other Ambulatory Visit: Payer: Self-pay

## 2020-04-06 VITALS — BP 138/79 | Wt 170.0 lb

## 2020-04-06 DIAGNOSIS — Z3A28 28 weeks gestation of pregnancy: Secondary | ICD-10-CM

## 2020-04-06 DIAGNOSIS — Z3483 Encounter for supervision of other normal pregnancy, third trimester: Secondary | ICD-10-CM

## 2020-04-06 DIAGNOSIS — Z3480 Encounter for supervision of other normal pregnancy, unspecified trimester: Secondary | ICD-10-CM

## 2020-04-06 LAB — POCT URINALYSIS DIPSTICK OB
Glucose, UA: NEGATIVE
POC,PROTEIN,UA: NEGATIVE

## 2020-04-06 NOTE — Progress Notes (Signed)
Routine Prenatal Care Visit  Subjective  Erika Moore is a 34 y.o. G1P0000 at 65w6dbeing seen today for ongoing prenatal care.  She is currently monitored for the following issues for this low-risk pregnancy and has Allergic rhinitis; Allergy; Asthma, well controlled; Gastroesophageal reflux disease; BRCA negative; and Supervision of other normal pregnancy, antepartum on their problem list.  ----------------------------------------------------------------------------------- Patient reports no complaints.   Contractions: Not present. Vag. Bleeding: None.  Movement: Present. Denies leaking of fluid.  ----------------------------------------------------------------------------------- The following portions of the patient's history were reviewed and updated as appropriate: allergies, current medications, past family history, past medical history, past social history, past surgical history and problem list. Problem list updated.   Objective  Blood pressure 138/79, weight 170 lb (77.1 kg), last menstrual period 09/17/2019. Pregravid weight 154 lb (69.9 kg) Total Weight Gain 16 lb (7.258 kg) Urinalysis:      Fetal Status: Fetal Heart Rate (bpm): 140 Fundal Height: 28 cm Movement: Present     General:  Alert, oriented and cooperative. Patient is in no acute distress.  Skin: Skin is warm and dry. No rash noted.   Cardiovascular: Normal heart rate noted  Respiratory: Normal respiratory effort, no problems with respiration noted  Abdomen: Soft, gravid, appropriate for gestational age. Pain/Pressure: Absent     Pelvic:  Cervical exam deferred        Extremities: Normal range of motion.     ental Status: Normal mood and affect. Normal behavior. Normal judgment and thought content.     Assessment   34y.o. G1P0000 at 263w6dy  06/23/2020, by Last Menstrual Period presenting for routine prenatal visit  Plan   pregnancy 1  Problems (from 09/17/19 to present)    Problem Noted Resolved    Supervision of other normal pregnancy, antepartum 11/05/2019 by StMalachy MoodMD No   Overview Addendum 01/24/2020  2:27 PM by StMalachy MoodMD    [facility]inic [facility] Prenatal Labs  Dating LMP = 9 week USKorealood type: O/Positive/-- (01/04 1540)   Genetic Screen Fragile-X neg, SMA neg, CF neg NIPS: Normal XX Antibody:Negative (01/04 1540)  Anatomic USKoreaormal Rubella: 2.89 (01/04 1540) Varicella: Immune  GTT    Third trimester:  RPR: Non Reactive (01/04 1540)   Rhogam             n/a HBsAg: Negative (01/04 1540)   TDaP vaccine             Flu Shot: declines HIV: Non Reactive (01/04 1540)   Baby Food             Br                   GBS:   Contraception             Pill Pap:10/18 normal, needs pp  CBB              No   CS/VBAC             n/a   Support Person            Previous Version       Gestational age appropriate obstetric precautions including but not limited to vaginal bleeding, contractions, leaking of fluid and fetal movement were reviewed in detail with the patient.    - 28 week labs today  Return in about 2 weeks (around 04/20/2020) for ROB.  AnMalachy MoodMD, [facility], [facility] 04/06/2020, 9:45 AM

## 2020-04-06 NOTE — Progress Notes (Signed)
ROB °28 week labs today °

## 2020-04-07 LAB — 28 WEEK RH+PANEL
Basophils Absolute: 0.1 10*3/uL (ref 0.0–0.2)
Basos: 1 %
EOS (ABSOLUTE): 0.2 10*3/uL (ref 0.0–0.4)
Eos: 2 %
Gestational Diabetes Screen: 103 mg/dL (ref 65–139)
HIV Screen 4th Generation wRfx: NONREACTIVE
Hematocrit: 34.1 % (ref 34.0–46.6)
Hemoglobin: 11.4 g/dL (ref 11.1–15.9)
Immature Grans (Abs): 0.1 10*3/uL (ref 0.0–0.1)
Immature Granulocytes: 1 %
Lymphocytes Absolute: 1.8 10*3/uL (ref 0.7–3.1)
Lymphs: 15 %
MCH: 29.4 pg (ref 26.6–33.0)
MCHC: 33.4 g/dL (ref 31.5–35.7)
MCV: 88 fL (ref 79–97)
Monocytes Absolute: 0.7 10*3/uL (ref 0.1–0.9)
Monocytes: 6 %
Neutrophils Absolute: 9.4 10*3/uL — ABNORMAL HIGH (ref 1.4–7.0)
Neutrophils: 75 %
Platelets: 268 10*3/uL (ref 150–450)
RBC: 3.88 x10E6/uL (ref 3.77–5.28)
RDW: 12.7 % (ref 11.7–15.4)
RPR Ser Ql: NONREACTIVE
WBC: 12.3 10*3/uL — ABNORMAL HIGH (ref 3.4–10.8)

## 2020-04-19 ENCOUNTER — Ambulatory Visit (INDEPENDENT_AMBULATORY_CARE_PROVIDER_SITE_OTHER): Payer: BC Managed Care – PPO | Admitting: Obstetrics and Gynecology

## 2020-04-19 ENCOUNTER — Other Ambulatory Visit: Payer: Self-pay

## 2020-04-19 ENCOUNTER — Encounter: Payer: Self-pay | Admitting: Obstetrics and Gynecology

## 2020-04-19 ENCOUNTER — Observation Stay
Admission: EM | Admit: 2020-04-19 | Discharge: 2020-04-19 | Disposition: A | Discharge status: Home / Self Care | Payer: BC Managed Care – PPO | Attending: Obstetrics and Gynecology | Admitting: Obstetrics and Gynecology

## 2020-04-19 VITALS — BP 130/90 | Wt 175.0 lb

## 2020-04-19 DIAGNOSIS — Z3480 Encounter for supervision of other normal pregnancy, unspecified trimester: Secondary | ICD-10-CM

## 2020-04-19 DIAGNOSIS — Z3A30 30 weeks gestation of pregnancy: Secondary | ICD-10-CM | POA: Diagnosis not present

## 2020-04-19 DIAGNOSIS — R030 Elevated blood-pressure reading, without diagnosis of hypertension: Secondary | ICD-10-CM | POA: Diagnosis present

## 2020-04-19 DIAGNOSIS — Z8041 Family history of malignant neoplasm of ovary: Secondary | ICD-10-CM | POA: Diagnosis not present

## 2020-04-19 DIAGNOSIS — O163 Unspecified maternal hypertension, third trimester: Secondary | ICD-10-CM | POA: Diagnosis not present

## 2020-04-19 DIAGNOSIS — O99513 Diseases of the respiratory system complicating pregnancy, third trimester: Secondary | ICD-10-CM | POA: Diagnosis not present

## 2020-04-19 DIAGNOSIS — R519 Headache, unspecified: Secondary | ICD-10-CM | POA: Diagnosis not present

## 2020-04-19 DIAGNOSIS — J45909 Unspecified asthma, uncomplicated: Secondary | ICD-10-CM | POA: Diagnosis not present

## 2020-04-19 DIAGNOSIS — O99613 Diseases of the digestive system complicating pregnancy, third trimester: Secondary | ICD-10-CM | POA: Diagnosis not present

## 2020-04-19 DIAGNOSIS — Z803 Family history of malignant neoplasm of breast: Secondary | ICD-10-CM | POA: Insufficient documentation

## 2020-04-19 DIAGNOSIS — O26893 Other specified pregnancy related conditions, third trimester: Secondary | ICD-10-CM | POA: Diagnosis not present

## 2020-04-19 DIAGNOSIS — K219 Gastro-esophageal reflux disease without esophagitis: Secondary | ICD-10-CM | POA: Diagnosis not present

## 2020-04-19 DIAGNOSIS — Z79899 Other long term (current) drug therapy: Secondary | ICD-10-CM | POA: Diagnosis not present

## 2020-04-19 DIAGNOSIS — Z23 Encounter for immunization: Secondary | ICD-10-CM

## 2020-04-19 DIAGNOSIS — Z882 Allergy status to sulfonamides status: Secondary | ICD-10-CM | POA: Insufficient documentation

## 2020-04-19 LAB — COMPREHENSIVE METABOLIC PANEL
ALT: 25 U/L (ref 0–44)
AST: 24 U/L (ref 15–41)
Albumin: 3 g/dL — ABNORMAL LOW (ref 3.5–5.0)
Alkaline Phosphatase: 131 U/L — ABNORMAL HIGH (ref 38–126)
Anion gap: 8 (ref 5–15)
BUN: 6 mg/dL (ref 6–20)
CO2: 22 mmol/L (ref 22–32)
Calcium: 8.9 mg/dL (ref 8.9–10.3)
Chloride: 105 mmol/L (ref 98–111)
Creatinine, Ser: 0.53 mg/dL (ref 0.44–1.00)
GFR calc Af Amer: >60 mL/min (ref >60)
GFR calc non Af Amer: >60 mL/min (ref >60)
Glucose, Bld: 83 mg/dL (ref 70–99)
Potassium: 4.1 mmol/L (ref 3.5–5.1)
Sodium: 135 mmol/L (ref 135–145)
Total Bilirubin: 0.6 mg/dL (ref 0.3–1.2)
Total Protein: 6.5 g/dL (ref 6.5–8.1)

## 2020-04-19 LAB — POCT URINALYSIS DIPSTICK OB
Glucose, UA: NEGATIVE
POC,PROTEIN,UA: NEGATIVE

## 2020-04-19 LAB — CBC
HCT: 32.9 % — ABNORMAL LOW (ref 36.0–46.0)
Hemoglobin: 11 g/dL — ABNORMAL LOW (ref 12.0–15.0)
MCH: 28.9 pg (ref 26.0–34.0)
MCHC: 33.4 g/dL (ref 30.0–36.0)
MCV: 86.4 fL (ref 80.0–100.0)
Platelets: 281 10*3/uL (ref 150–400)
RBC: 3.81 MIL/uL — ABNORMAL LOW (ref 3.87–5.11)
RDW: 13.1 % (ref 11.5–15.5)
WBC: 13.5 10*3/uL — ABNORMAL HIGH (ref 4.0–10.5)
nRBC: 0 % (ref 0.0–0.2)

## 2020-04-19 LAB — PROTEIN / CREATININE RATIO, URINE
Creatinine, Urine: 32 mg/dL
Total Protein, Urine: <6 mg/dL

## 2020-04-19 MED ORDER — ACETAMINOPHEN 325 MG PO TABS
650.0000 mg | ORAL_TABLET | ORAL | Status: DC | PRN
  Administered 2020-04-19: 650 mg via ORAL
  Filled 2020-04-19: qty 2

## 2020-04-19 NOTE — OB Triage Note (Signed)
Pt is a 34yo G1P0 at [redacted]w[redacted]d that was sent over from [facility] office with elevated blood pressure, headache and spots in her vision. Pt denies VB, LOF, or contractions and states positve FM. EFM applied and initial FHT 145. Initial BP 118/73 and cycling q 63min.

## 2020-04-19 NOTE — Progress Notes (Signed)
Routine Prenatal Care Visit  Subjective  Erika Moore is a 34 y.o. G1P0000 at 44w5dbeing seen today for ongoing prenatal care.  She is currently monitored for the following issues for this low-risk pregnancy and has Allergic rhinitis; Allergy; Asthma, well controlled; Gastroesophageal reflux disease; BRCA negative; and Supervision of other normal pregnancy, antepartum on their problem list.  ----------------------------------------------------------------------------------- Patient reports headache and scotomata.  Headaches have been self limited and relieved by Tylenol.  She does have a history of headaches outside of pregnancy as well.   Contractions: Not present. Vag. Bleeding: None.  Movement: Present. Denies leaking of fluid.  ----------------------------------------------------------------------------------- The following portions of the patient's history were reviewed and updated as appropriate: allergies, current medications, past family history, past medical history, past social history, past surgical history and problem list. Problem list updated.   Objective  Blood pressure 130/90, weight 175 lb (79.4 kg), last menstrual period 09/17/2019. Pregravid weight 154 lb (69.9 kg) Total Weight Gain 21 lb (9.526 kg) Urinalysis:      Fetal Status: Fetal Heart Rate (bpm): 140 Fundal Height: 31 cm Movement: Present     General:  Alert, oriented and cooperative. Patient is in no acute distress.  Skin: Skin is warm and dry. No rash noted.   Cardiovascular: Normal heart rate noted  Respiratory: Normal respiratory effort, no problems with respiration noted  Abdomen: Soft, gravid, appropriate for gestational age. Pain/Pressure: Absent     Pelvic:  Cervical exam deferred        Extremities: Normal range of motion.     ental Status: Normal mood and affect. Normal behavior. Normal judgment and thought content.     Assessment   34y.o. G1P0000 at 373w5dy  06/23/2020, by Last Menstrual  Period presenting for routine prenatal visit  Plan   pregnancy 1  Problems (from 09/17/19 to present)    Problem Noted Resolved   Supervision of other normal pregnancy, antepartum 11/05/2019 by StMalachy MoodMD No   Overview Addendum 04/08/2020 12:33 AM by StMalachy MoodMD    [facility] Prenatal Labs  Dating LMP = 9 week USKorealood type: O/Positive/-- (01/04 1540)   Genetic Screen Fragile-X neg, SMA neg, CF neg NIPS: Normal XX Antibody:Negative (01/04 1540)  Anatomic USKoreaormal Rubella: 2.89 (01/04 1540) Varicella: Immune  GTT    Third trimester: 103 RPR: Non Reactive (01/04 1540)   Rhogam             n/a HBsAg: Negative (01/04 1540)   TDaP vaccine             Flu Shot: declines HIV: Non Reactive (01/04 1540)   Baby Food             Br                   GBS:   Contraception             Pill Pap:10/18 normal, needs pp  CBB              No   CS/VBAC             n/a   Support Person            Previous Version       Gestational age appropriate obstetric precautions including but not limited to vaginal bleeding, contractions, leaking of fluid and fetal movement were reviewed in detail with the patient.    Elevated diastolic today. Will obtain pre-E labs  serial BP's on labor and delivery  Return in 2 days (on 04/21/2020) for ROB and BP check.  Andreas Staebler, MD, FACOG [facility], [facility] 04/19/2020, 10:26 AM    

## 2020-04-19 NOTE — Final Progress Note (Signed)
See final progress Note.  Imagene Riches, CNM  04/19/2020 1:26 PM

## 2020-04-19 NOTE — Discharge Summary (Signed)
Physician Final Progress Note  Patient ID: Erika Moore MRN: [id number] DOB/AGE: [birth year] 34 y.o.  Admit date: 04/19/2020 Admitting provider: Malachy Mood, MD Discharge date: 04/19/2020   Admission Diagnoses: OBS status for pre eclamptic workup.  Discharge Diagnoses:  Active Problems:   Elevated blood pressure affecting pregnancy in third trimester, antepartum    History of Present Illness: The patient is a 34 y.o. female G1P0000 at 70w5dwho presents for serial blood pressures and an NST. She was seen in the office today and her BPs were elevated..[facility] and BP checks. She denies any visual changes now. She does have a frontal headache, which she shares started after receiving the tdap shot at the office. Her baby is moving well, and she denies contractions, LOF, or vaginal bleeding. Erika Moore struggles with allergies and GERD; this is well documented.  Past Medical History:  Diagnosis Date  . Allergic   . Asthma   . BRCA negative 08/2019   MyRisk neg  . Family history of breast cancer 07/2019  . Family history of ovarian cancer 08/2019   MyRisk neg  . GERD (gastroesophageal reflux disease)   . HSV (herpes simplex virus) infection   . Immunization, viral disease    Gardasil vaccine completed  . Increased risk of breast cancer 08/2019   IBIS=20.2%/riskscore=16.5%  . TMJ (dislocation of temporomandibular joint)     Past Surgical History:  Procedure Laterality Date  . ESOPHAGOGASTRODUODENOSCOPY (EGD) WITH PROPOFOL N/A 09/03/2017   Procedure: ESOPHAGOGASTRODUODENOSCOPY (EGD) WITH PROPOFOL;  Surgeon: VLin Landsman MD;  [place]  Service: Endoscopy;  Laterality: N/A;  . HERNIA REPAIR    . HYSTEROSCOPY WITH D & C N/A 06/24/2019   Procedure: DILATATION AND CURETTAGE /HYSTEROSCOPY;  Surgeon: SMalachy Mood MD;  Location: [facility];  Service: Gynecology;  Laterality: N/A;  . MOLE REMOVAL    .  TONSILLECTOMY      No current facility-administered medications on file prior to encounter.   Current Outpatient Medications on File Prior to Encounter  Medication Sig Dispense Refill  . esomeprazole (NEXIUM) 20 MG capsule Take 20 mg by mouth every morning.     . Prenatal Vit-Fe Fumarate-FA (PRENATAL MULTIVITAMIN) TABS tablet Take 1 tablet by mouth daily at 12 noon.    .Marland Kitchenalbuterol (VENTOLIN HFA) 108 (90 Base) MCG/ACT inhaler Inhale 1-2 puffs into the lungs every 4 (four) hours as needed for wheezing or shortness of breath.    . montelukast (SINGULAIR) 10 MG tablet Take 10 mg by mouth at bedtime.  (Patient not taking: Reported on 04/19/2020)    . triamcinolone (NASACORT ALLERGY 24HR) 55 MCG/ACT AERO nasal inhaler Place 2 sprays into the nose daily as needed (allergies).  (Patient not taking: Reported on 04/19/2020)      Allergies  Allergen Reactions  . Other Itching    WALNUTS  . Sulfa Antibiotics Hives    Social History   Socioeconomic History  . Marital status: Married    Spouse name: Erika Moore . Number of children: Not on file  . Years of education: Not on file  . Highest education level: Not on file  Occupational History  . Not on file  Tobacco Use  . Smoking status: Never Smoker  . Smokeless tobacco: Never Used  Vaping Use  . Vaping Use: Never used  Substance and Sexual Activity  . Alcohol use: Yes    Comment: occasional  . Drug use: No  . Sexual activity:  Yes    Birth control/protection: Pill  Other Topics Concern  . Not on file  Social History Narrative  . Not on file   Social Determinants of Health   Financial Resource Strain:   . Difficulty of Paying Living Expenses:   Food Insecurity:   . Worried About Charity fundraiser in the Last Year:   . Arboriculturist in the Last Year:   Transportation Needs:   . Film/video editor (Medical):   Marland Kitchen Lack of Transportation (Non-Medical):   Physical Activity:   . Days of Exercise per Week:   . Minutes of Exercise  per Session:   Stress:   . Feeling of Stress :   Social Connections:   . Frequency of Communication with Friends and Family:   . Frequency of Social Gatherings with Friends and Family:   . Attends Religious Services:   . Active Member of Clubs or Organizations:   . Attends Archivist Meetings:   Marland Kitchen Marital Status:   Intimate Partner Violence:   . Fear of Current or Ex-Partner:   . Emotionally Abused:   Marland Kitchen Physically Abused:   . Sexually Abused:     Family History  Problem Relation Age of Onset  . GER disease Mother   . Irritable bowel syndrome Mother   . Hypertension Mother   . Pancreatic cancer Maternal Grandmother 29  . Ovarian cancer Paternal Grandmother 50  . Leukemia Paternal Grandfather   . Breast cancer Paternal Aunt 78       Genetic test negative     ROS   Physical Exam: BP 103/69   Pulse 95   Temp 97.6 F (36.4 C) (Oral)   Resp 14   Ht '5\' 3"'$  (1.6 m)   Wt 79.4 kg   LMP 09/17/2019   BMI 31.00 kg/m   OBGyn Exam  Consults: None  Significant Findings/ Diagnostic Studies: labs: AST_24, ALT 25.  plts WNL. Bps:107/69, 107/72,103/69  Procedures: NST  Hospital Course: The patient was admitted to Labor and Delivery Triage for observation. Evaluated , and cleared for discharge.  Discharge Condition: good  Disposition: Discharge disposition: 01-Home or Self Care       Diet: Regular diet  Discharge Activity: Activity as tolerated  Discharge Instructions    Discharge activity:  No Restrictions   Complete by: As directed    Discharge diet:  No restrictions   Complete by: As directed    No sexual activity restrictions   Complete by: As directed    Notify physician for a general feeling that "something is not right"   Complete by: As directed    Notify physician for increase or change in vaginal discharge   Complete by: As directed    Notify physician for intestinal cramps, with or without diarrhea, sometimes described as "gas pain"    Complete by: As directed    Notify physician for leaking of fluid   Complete by: As directed    Notify physician for low, dull backache, unrelieved by heat or Tylenol   Complete by: As directed    Notify physician for menstrual like cramps   Complete by: As directed    Notify physician for pelvic pressure   Complete by: As directed    Notify physician for uterine contractions.  These may be painless and feel like the uterus is tightening or the baby is  "balling up"   Complete by: As directed    Notify physician for vaginal bleeding  Complete by: As directed    PRETERM LABOR:  Includes any of the follwing symptoms that occur between 20 - [redacted] weeks gestation.  If these symptoms are not stopped, preterm labor can result in preterm delivery, placing your baby at risk   Complete by: As directed      Allergies as of 04/19/2020      Reactions   Other Itching   WALNUTS   Sulfa Antibiotics Hives      Medication List    TAKE these medications   albuterol 108 (90 Base) MCG/ACT inhaler Commonly known as: VENTOLIN HFA Inhale 1-2 puffs into the lungs every 4 (four) hours as needed for wheezing or shortness of breath.   esomeprazole 20 MG capsule Commonly known as: NEXIUM Take 20 mg by mouth every morning.   montelukast 10 MG tablet Commonly known as: SINGULAIR Take 10 mg by mouth at bedtime.   Nasacort Allergy 24HR 55 MCG/ACT Aero nasal inhaler Generic drug: triamcinolone Place 2 sprays into the nose daily as needed (allergies).   prenatal multivitamin Tabs tablet Take 1 tablet by mouth daily at 12 noon.        Total time spent taking care of this patient: 30 minutes  Signed: Imagene Riches, CNM  04/19/2020, 1:20 PM

## 2020-04-21 ENCOUNTER — Encounter: Payer: Self-pay | Admitting: Advanced Practice Midwife

## 2020-04-21 ENCOUNTER — Ambulatory Visit (INDEPENDENT_AMBULATORY_CARE_PROVIDER_SITE_OTHER): Payer: BC Managed Care – PPO | Admitting: Advanced Practice Midwife

## 2020-04-21 ENCOUNTER — Other Ambulatory Visit: Payer: Self-pay

## 2020-04-21 VITALS — BP 118/78 | Wt 173.0 lb

## 2020-04-21 DIAGNOSIS — Z3A31 31 weeks gestation of pregnancy: Secondary | ICD-10-CM

## 2020-04-21 DIAGNOSIS — Z3483 Encounter for supervision of other normal pregnancy, third trimester: Secondary | ICD-10-CM

## 2020-04-21 NOTE — Progress Notes (Signed)
No vb. No lof.  

## 2020-04-21 NOTE — Progress Notes (Signed)
Routine Prenatal Care Visit  Subjective  Erika Moore is a 34 y.o. G1P0000 at 64w0dbeing seen today for ongoing prenatal care.  She is currently monitored for the following issues for this low-risk pregnancy and has Allergic rhinitis; Allergy; Asthma, well controlled; Gastroesophageal reflux disease; BRCA negative; Supervision of other normal pregnancy, antepartum; Elevated blood pressure affecting pregnancy in third trimester, antepartum; and HSV-1 infection on their problem list.  ----------------------------------------------------------------------------------- Patient reports feeling well. She has had a left frontal headache she thinks is sinus related. No visual changes or epigastric pain since Wednesday.   Contractions: Not present. Vag. Bleeding: None.  Movement: Present. Leaking Fluid denies.  ----------------------------------------------------------------------------------- The following portions of the patient's history were reviewed and updated as appropriate: allergies, current medications, past family history, past medical history, past social history, past surgical history and problem list. Problem list updated.  Objective  Blood pressure 118/78, weight 173 lb (78.5 kg), last menstrual period 09/17/2019. Pregravid weight 154 lb (69.9 kg) Total Weight Gain 19 lb (8.618 kg) Urinalysis: Urine Protein    Urine Glucose    Fetal Status: Fetal Heart Rate (bpm): 156   Movement: Present     General:  Alert, oriented and cooperative. Patient is in no acute distress.  Skin: Skin is warm and dry. No rash noted.   Cardiovascular: Normal heart rate noted  Respiratory: Normal respiratory effort, no problems with respiration noted  Abdomen: Soft, gravid, appropriate for gestational age. Pain/Pressure: Absent     Pelvic:  Cervical exam deferred        Extremities: Normal range of motion.  Edema: None  Mental Status: Normal mood and affect. Normal behavior. Normal judgment and thought  content.   Assessment   34y.o. G1P0000 at 329w0dy  06/23/2020, by Last Menstrual Period presenting for blood pressure check prenatal visit  Plan   pregnancy 1  Problems (from 09/17/19 to present)    Problem Noted Resolved   Supervision of other normal pregnancy, antepartum 11/05/2019 by StMalachy MoodMD No   Overview Addendum 04/08/2020 12:33 AM by StMalachy MoodMD    [facility] Prenatal Labs  Dating LMP = 9 week USKorealood type: O/Positive/-- (01/04 1540)   Genetic Screen Fragile-X neg, SMA neg, CF neg NIPS: Normal XX Antibody:Negative (01/04 1540)  Anatomic US[place]oreaormal Rubella: 2.89 (01/04 1540) Varicella: Immune  GTT    Third trimester: 103 RPR: Non Reactive (01/04 1540)   Rhogam             n/a HBsAg: Negative (01/04 1540)   TDaP vaccine             Flu Shot: declines HIV: Non Reactive (01/04 1540)   Baby Food             Br                   GBS:   Contraception             Pill Pap:10/18 normal, needs pp  CBB              No   CS/VBAC             n/a   Support Person            Previous Version       Preterm labor symptoms and general obstetric precautions including but not limited to vaginal bleeding, contractions, leaking of fluid and fetal movement were reviewed in detail with the patient.  Return in about 2 weeks (around 05/05/2020) for rob.  Rod Can, CNM 04/21/2020 2:07 PM

## 2020-05-08 ENCOUNTER — Other Ambulatory Visit: Payer: Self-pay

## 2020-05-08 ENCOUNTER — Ambulatory Visit (INDEPENDENT_AMBULATORY_CARE_PROVIDER_SITE_OTHER): Payer: BC Managed Care – PPO | Admitting: Obstetrics and Gynecology

## 2020-05-08 VITALS — BP 100/70 | Ht 63.0 in | Wt 178.0 lb

## 2020-05-08 DIAGNOSIS — Z3480 Encounter for supervision of other normal pregnancy, unspecified trimester: Secondary | ICD-10-CM

## 2020-05-08 DIAGNOSIS — Z3A33 33 weeks gestation of pregnancy: Secondary | ICD-10-CM

## 2020-05-08 LAB — POCT URINALYSIS DIPSTICK OB
Glucose, UA: NEGATIVE
POC,PROTEIN,UA: NEGATIVE

## 2020-05-08 NOTE — Progress Notes (Signed)
Routine Prenatal Care Visit  Subjective  Erika Moore is a 34 y.o. G1P0000 at 47w3dbeing seen today for ongoing prenatal care.  She is currently monitored for the following issues for this low-risk pregnancy and has Allergic rhinitis; Allergy; Asthma, well controlled; Gastroesophageal reflux disease; BRCA negative; Supervision of other normal pregnancy, antepartum; Elevated blood pressure affecting pregnancy in third trimester, antepartum; and HSV-1 infection on their problem list.  ----------------------------------------------------------------------------------- Patient reports no complaints.   Contractions: Not present. Vag. Bleeding: None.  Movement: Present. Denies leaking of fluid.  ----------------------------------------------------------------------------------- The following portions of the patient's history were reviewed and updated as appropriate: allergies, current medications, past family history, past medical history, past social history, past surgical history and problem list. Problem list updated.   Objective  Blood pressure 100/70, height '5\' 3"'$  (1.6 m), weight 178 lb (80.7 kg), last menstrual period 09/17/2019. Pregravid weight 154 lb (69.9 kg) Total Weight Gain 24 lb (10.9 kg) Urinalysis:      Fetal Status: Fetal Heart Rate (bpm): 145 Fundal Height: 33 cm Movement: Present  Presentation: Vertex  General:  Alert, oriented and cooperative. Patient is in no acute distress.  Skin: Skin is warm and dry. No rash noted.   Cardiovascular: Normal heart rate noted  Respiratory: Normal respiratory effort, no problems with respiration noted  Abdomen: Soft, gravid, appropriate for gestational age. Pain/Pressure: Absent     Pelvic:  Cervical exam deferred        Extremities: Normal range of motion.     ental Status: Normal mood and affect. Normal behavior. Normal judgment and thought content.     Assessment   34y.o. G1P0000 at 314w3dy  06/23/2020, by Last Menstrual  Period presenting for routine prenatal visit  Plan   pregnancy 1  Problems (from 09/17/19 to present)    Problem Noted Resolved   Supervision of other normal pregnancy, antepartum 11/05/2019 by StMalachy MoodMD No   Overview Addendum 04/08/2020 12:33 AM by StMalachy MoodMD    [facility] Prenatal Labs  Dating LMP = 9 week USKorealood type: O/Positive/-- (01/04 1540)   Genetic Screen Fragile-X neg, SMA neg, CF neg NIPS: Normal XX Antibody:Negative (01/04 1540)  Anatomic USKoreaormal Rubella: 2.89 (01/04 1540) Varicella: Immune  GTT    Third trimester: 103 RPR: Non Reactive (01/04 1540)   Rhogam             n/a HBsAg: Negative (01/04 1540)   TDaP vaccine             Flu Shot: declines HIV: Non Reactive (01/04 1540)   Baby Food             Br                   GBS:   Contraception             Pill Pap:10/18 normal, needs pp  CBB              No   CS/VBAC             n/a   Support Person            Previous Version       Gestational age appropriate obstetric precautions including but not limited to vaginal bleeding, contractions, leaking of fluid and fetal movement were reviewed in detail with the patient.    Return in about 2 weeks (around 05/22/2020) for ROB.  AnMalachy MoodMD, FAB[facility]  [facility], [facility] 05/08/2020, 8:46 AM

## 2020-05-22 ENCOUNTER — Ambulatory Visit (INDEPENDENT_AMBULATORY_CARE_PROVIDER_SITE_OTHER): Payer: BC Managed Care – PPO | Admitting: Obstetrics and Gynecology

## 2020-05-22 ENCOUNTER — Other Ambulatory Visit: Payer: Self-pay

## 2020-05-22 VITALS — BP 110/80 | Wt 179.0 lb

## 2020-05-22 DIAGNOSIS — Z3A35 35 weeks gestation of pregnancy: Secondary | ICD-10-CM

## 2020-05-22 LAB — POCT URINALYSIS DIPSTICK OB
Glucose, UA: NEGATIVE
POC,PROTEIN,UA: NEGATIVE

## 2020-05-22 NOTE — Progress Notes (Signed)
ROB- no concerns 

## 2020-05-22 NOTE — Progress Notes (Signed)
Routine Prenatal Care Visit  Subjective  Erika Moore is a 34 y.o. G1P0000 at 42w3dbeing seen today for ongoing prenatal care.  She is currently monitored for the following issues for this low-risk pregnancy and has Allergic rhinitis; Allergy; Asthma, well controlled; Gastroesophageal reflux disease; BRCA negative; Supervision of other normal pregnancy, antepartum; Elevated blood pressure affecting pregnancy in third trimester, antepartum; and HSV-1 infection on their problem list.  ----------------------------------------------------------------------------------- Patient reports no complaints.   Contractions: Irregular. Vag. Bleeding: None.  Movement: Present. Denies leaking of fluid.  ----------------------------------------------------------------------------------- The following portions of the patient's history were reviewed and updated as appropriate: allergies, current medications, past family history, past medical history, past social history, past surgical history and problem list. Problem list updated.   Objective  Blood pressure 110/80, weight 179 lb (81.2 kg), last menstrual period 09/17/2019. Pregravid weight 154 lb (69.9 kg) Total Weight Gain 25 lb (11.3 kg) Urinalysis:      Fetal Status: Fetal Heart Rate (bpm): 125 Fundal Height: 35 cm Movement: Present  Presentation: Vertex  General:  Alert, oriented and cooperative. Patient is in no acute distress.  Skin: Skin is warm and dry. No rash noted.   Cardiovascular: Normal heart rate noted  Respiratory: Normal respiratory effort, no problems with respiration noted  Abdomen: Soft, gravid, appropriate for gestational age. Pain/Pressure: Absent     Pelvic:  Cervical exam deferred        Extremities: Normal range of motion.     ental Status: Normal mood and affect. Normal behavior. Normal judgment and thought content.     Assessment   34y.o. G1P0000 at 364w3dy  06/23/2020, by Last Menstrual Period presenting for  routine prenatal visit  Plan   pregnancy 1  Problems (from 09/17/19 to present)    Problem Noted Resolved   Supervision of other normal pregnancy, antepartum 11/05/2019 by StMalachy MoodMD No   Overview Addendum 04/08/2020 12:33 AM by StMalachy MoodMD    [facility] Prenatal Labs  Dating LMP = 9 week USKorealood type: O/Positive/-- (01/04 1540)   Genetic Screen Fragile-X neg, SMA neg, CF neg NIPS: Normal XX Antibody:Negative (01/04 1540)  Anatomic USKoreaormal Rubella: 2.89 (01/04 1540) Varicella: Immune  GTT    Third trimester: 103 RPR: Non Reactive (01/04 1540)   Rhogam             n/a HBsAg: Negative (01/04 1540)   TDaP vaccine             Flu Shot: declines HIV: Non Reactive (01/04 1540)   Baby Food             Br                   GBS:   Contraception             Pill Pap:10/18 normal, needs pp  CBB              No   CS/VBAC             n/a   Support Person            Previous Version       Gestational age appropriate obstetric precautions including but not limited to vaginal bleeding, contractions, leaking of fluid and fetal movement were reviewed in detail with the patient.    Return in about 1 week (around 05/29/2020) for ROB.  AnMalachy MoodMD, [facility]ardon[facility], [facility] 05/22/2020,  11:44 AM

## 2020-06-01 ENCOUNTER — Ambulatory Visit (INDEPENDENT_AMBULATORY_CARE_PROVIDER_SITE_OTHER): Payer: BC Managed Care – PPO | Admitting: Obstetrics and Gynecology

## 2020-06-01 ENCOUNTER — Other Ambulatory Visit: Payer: Self-pay

## 2020-06-01 VITALS — BP 120/80 | Wt 180.0 lb

## 2020-06-01 DIAGNOSIS — Z3480 Encounter for supervision of other normal pregnancy, unspecified trimester: Secondary | ICD-10-CM

## 2020-06-01 DIAGNOSIS — J4520 Mild intermittent asthma, uncomplicated: Secondary | ICD-10-CM

## 2020-06-01 DIAGNOSIS — Z3483 Encounter for supervision of other normal pregnancy, third trimester: Secondary | ICD-10-CM

## 2020-06-01 DIAGNOSIS — Z3A36 36 weeks gestation of pregnancy: Secondary | ICD-10-CM

## 2020-06-01 DIAGNOSIS — Z3685 Encounter for antenatal screening for Streptococcus B: Secondary | ICD-10-CM

## 2020-06-01 NOTE — Progress Notes (Signed)
Routine Prenatal Care Visit  Subjective  Erika Moore is a 34 y.o. G1P0000 at 69w6dbeing seen today for ongoing prenatal care.  She is currently monitored for the following issues for this low-risk pregnancy and has Allergic rhinitis; Allergy; Asthma, well controlled; Gastroesophageal reflux disease; BRCA negative; Supervision of other normal pregnancy, antepartum; Elevated blood pressure affecting pregnancy in third trimester, antepartum; and HSV-1 infection on their problem list.  ----------------------------------------------------------------------------------- Patient reports headaches.   Contractions: Irregular. Vag. Bleeding: None.  Movement: Present. Denies leaking of fluid.  ----------------------------------------------------------------------------------- The following portions of the patient's history were reviewed and updated as appropriate: allergies, current medications, past family history, past medical history, past social history, past surgical history and problem list. Problem list updated.   Objective  Blood pressure 120/80, weight 180 lb (81.6 kg), last menstrual period 09/17/2019. Pregravid weight 154 lb (69.9 kg) Total Weight Gain 26 lb (11.8 kg) Urinalysis:      Fetal Status: Fetal Heart Rate (bpm): 140 Fundal Height: 36 cm Movement: Present  Presentation: Vertex  General:  Alert, oriented and cooperative. Patient is in no acute distress.  Skin: Skin is warm and dry. No rash noted.   Cardiovascular: Normal heart rate noted  Respiratory: Normal respiratory effort, no problems with respiration noted  Abdomen: Soft, gravid, appropriate for gestational age. Pain/Pressure: Absent     Pelvic:  Cervical exam performed Dilation: Closed Effacement (%): 50 Station: -3  Extremities: Normal range of motion.     ental Status: Normal mood and affect. Normal behavior. Normal judgment and thought content.     Assessment   34y.o. G1P0000 at 380w6dy  06/23/2020, by  Last Menstrual Period presenting for routine prenatal visit  Plan   pregnancy 1  Problems (from 09/17/19 to present)    Problem Noted Resolved   Supervision of other normal pregnancy, antepartum 11/05/2019 by StMalachy MoodMD No   Overview Addendum 04/08/2020 12:33 AM by StMalachy MoodMD    [facility] Prenatal Labs  Dating LMP = 9 week USKorealood type: O/Positive/-- (01/04 1540)   Genetic Screen Fragile-X neg, SMA neg, CF neg NIPS: Normal XX Antibody:Negative (01/04 1540)  Anatomic USKoreaormal Rubella: 2.89 (01/04 1540) Varicella: Immune  GTT    Third trimester: 103 RPR: Non Reactive (01/04 1540)   Rhogam             n/a HBsAg: Negative (01/04 1540)   TDaP vaccine             Flu Shot: declines HIV: Non Reactive (01/04 1540)   Baby Food             Br                   GBS:   Contraception             Pill Pap:10/18 normal, needs pp  CBB              No   CS/VBAC             n/a   Support Person            Previous Version       Gestational age appropriate obstetric precautions including but not limited to vaginal bleeding, contractions, leaking of fluid and fetal movement were reviewed in detail with the patient.    1) Headaches - normotensive today, wt stable from last visit (+1lbs).  Has home BP cuff discussed to represent should she  get elevated reading to date home reading have also been normal  2) GBS collected today  Return in about 1 week (around 06/08/2020) for ROB.  Malachy Mood, MD, [facility], [facility] 06/01/2020, 9:23 AM

## 2020-06-01 NOTE — Progress Notes (Signed)
ROB Feet swelling Headaches GBS today

## 2020-06-03 LAB — STREP GP B NAA: Strep Gp B NAA: POSITIVE — AB

## 2020-06-04 ENCOUNTER — Encounter: Payer: Self-pay | Admitting: Obstetrics and Gynecology

## 2020-06-04 DIAGNOSIS — B951 Streptococcus, group B, as the cause of diseases classified elsewhere: Secondary | ICD-10-CM | POA: Insufficient documentation

## 2020-06-06 ENCOUNTER — Ambulatory Visit (INDEPENDENT_AMBULATORY_CARE_PROVIDER_SITE_OTHER): Payer: BC Managed Care – PPO | Admitting: Obstetrics and Gynecology

## 2020-06-06 ENCOUNTER — Other Ambulatory Visit: Payer: Self-pay

## 2020-06-06 VITALS — BP 126/70 | Wt 182.0 lb

## 2020-06-06 DIAGNOSIS — Z3480 Encounter for supervision of other normal pregnancy, unspecified trimester: Secondary | ICD-10-CM

## 2020-06-06 DIAGNOSIS — Z3A37 37 weeks gestation of pregnancy: Secondary | ICD-10-CM

## 2020-06-06 DIAGNOSIS — O163 Unspecified maternal hypertension, third trimester: Secondary | ICD-10-CM

## 2020-06-06 DIAGNOSIS — B951 Streptococcus, group B, as the cause of diseases classified elsewhere: Secondary | ICD-10-CM

## 2020-06-06 NOTE — Progress Notes (Signed)
Routine Prenatal Care Visit  Subjective  Erika Moore is a 34 y.o. G1P0000 at 54w4dbeing seen today for ongoing prenatal care.  She is currently monitored for the following issues for this low-risk pregnancy and has Allergic rhinitis; Allergy; Asthma, well controlled; Gastroesophageal reflux disease; BRCA negative; Supervision of other normal pregnancy, antepartum; Elevated blood pressure affecting pregnancy in third trimester, antepartum; HSV-1 infection; and Positive GBS test on their problem list.  ----------------------------------------------------------------------------------- Patient reports no complaints.   Contractions: Irregular. Vag. Bleeding: None.  Movement: Present. Denies leaking of fluid.  ----------------------------------------------------------------------------------- The following portions of the patient's history were reviewed and updated as appropriate: allergies, current medications, past family history, past medical history, past social history, past surgical history and problem list. Problem list updated.   Objective  Blood pressure 126/70, weight 182 lb (82.6 kg), last menstrual period 09/17/2019. Pregravid weight 154 lb (69.9 kg) Total Weight Gain 28 lb (12.7 kg) Urinalysis:      Fetal Status: Fetal Heart Rate (bpm): 145 Fundal Height: 37 cm Movement: Present  Presentation: Vertex  General:  Alert, oriented and cooperative. Patient is in no acute distress.  Skin: Skin is warm and dry. No rash noted.   Cardiovascular: Normal heart rate noted  Respiratory: Normal respiratory effort, no problems with respiration noted  Abdomen: Soft, gravid, appropriate for gestational age. Pain/Pressure: Present     Pelvic:  Cervical exam performed Dilation: Fingertip Effacement (%): 50 Station: -3  Extremities: Normal range of motion.     ental Status: Normal mood and affect. Normal behavior. Normal judgment and thought content.     Assessment   34y.o. G1P0000 at  349w4dy  06/23/2020, by Last Menstrual Period presenting for routine prenatal visit  Plan   pregnancy 1  Problems (from 09/17/19 to present)    Problem Noted Resolved   Positive GBS test 06/04/2020 by StMalachy MoodMD No   Supervision of other normal pregnancy, antepartum 11/05/2019 by StMalachy MoodMD No   Overview Addendum 06/04/2020  8:13 PM by StMalachy MoodMD    [facility] Prenatal [facility]  Dating LMP = 9 week USKorealood type: O/Positive/-- (01/04 1540)   Genetic Screen Fragile-X neg, SMA neg, CF neg NIPS: Normal XX Antibody:Negative (01/04 1540)  Anatomic USKoreaormal Rubella: 2.89 (01/04 1540) Varicella: Immune  GTT    Third trimester: 103 RPR: Non Reactive (01/04 1540)   Rhogam             n/a HBsAg: Negative (01/04 1540)   TDaP vaccine 04/19/2020 Flu Shot: declines HIV: Non Reactive (01/04 1540)   Baby Food             Br                   GBOHY:[id number]             Pill Pap:10/18 normal, needs pp  CBB              No   CS/VBAC             n/a   Support Person            Previous Version       Gestational age appropriate obstetric precautions including but not limited to vaginal bleeding, contractions, leaking of fluid and fetal movement were reviewed in detail with the patient.    Return in about 1 week (around 06/13/2020) for ROB (Addie Cederberg ok to be 1:15 if needed).  Malachy Mood, MD, [facility], [facility] 06/06/2020, 1:59 PM

## 2020-06-06 NOTE — Progress Notes (Signed)
ROB

## 2020-06-13 ENCOUNTER — Ambulatory Visit (INDEPENDENT_AMBULATORY_CARE_PROVIDER_SITE_OTHER): Payer: BC Managed Care – PPO | Admitting: Obstetrics and Gynecology

## 2020-06-13 ENCOUNTER — Other Ambulatory Visit: Payer: Self-pay

## 2020-06-13 VITALS — BP 130/88 | Wt 183.0 lb

## 2020-06-13 DIAGNOSIS — B951 Streptococcus, group B, as the cause of diseases classified elsewhere: Secondary | ICD-10-CM

## 2020-06-13 DIAGNOSIS — Z3483 Encounter for supervision of other normal pregnancy, third trimester: Secondary | ICD-10-CM

## 2020-06-13 DIAGNOSIS — Z3A38 38 weeks gestation of pregnancy: Secondary | ICD-10-CM

## 2020-06-13 DIAGNOSIS — Z3480 Encounter for supervision of other normal pregnancy, unspecified trimester: Secondary | ICD-10-CM

## 2020-06-13 NOTE — Progress Notes (Signed)
ROB

## 2020-06-13 NOTE — Progress Notes (Signed)
Routine Prenatal Care Visit  Subjective  Erika Moore is a 34 y.o. G1P0000 at 67w4dbeing seen today for ongoing prenatal care.  She is currently monitored for the following issues for this low-risk pregnancy and has Allergic rhinitis; Allergy; Asthma, well controlled; Gastroesophageal reflux disease; BRCA negative; Supervision of other normal pregnancy, antepartum; Elevated blood pressure affecting pregnancy in third trimester, antepartum; HSV-1 infection; and Positive GBS test on their problem list.  ----------------------------------------------------------------------------------- Patient reports no complaints.   Contractions: Irregular. Vag. Bleeding: None.  Movement: Present. Denies leaking of fluid.  ----------------------------------------------------------------------------------- The following portions of the patient's history were reviewed and updated as appropriate: allergies, current medications, past family history, past medical history, past social history, past surgical history and problem list. Problem list updated.   Objective  Blood pressure 130/88, weight 183 lb (83 kg), last menstrual period 09/17/2019. Pregravid weight 154 lb (69.9 kg) Total Weight Gain 29 lb (13.2 kg) Urinalysis:      Fetal Status: Fetal Heart Rate (bpm): 130 Fundal Height: 37 cm Movement: Present  Presentation: Vertex  General:  Alert, oriented and cooperative. Patient is in no acute distress.  Skin: Skin is warm and dry. No rash noted.   Cardiovascular: Normal heart rate noted  Respiratory: Normal respiratory effort, no problems with respiration noted  Abdomen: Soft, gravid, appropriate for gestational age. Pain/Pressure: Present     Pelvic:  Cervical exam performed Dilation: Fingertip Effacement (%): 50 Station: -3  Extremities: Normal range of motion.     ental Status: Normal mood and affect. Normal behavior. Normal judgment and thought content.     Assessment   34y.o. G1P0000 at  349w4dy  06/23/2020, by Last Menstrual Period presenting for routine prenatal visit  Plan   pregnancy 1  Problems (from 09/17/19 to present)    Problem Noted Resolved   Positive GBS test 06/04/2020 by StMalachy MoodMD No   Supervision of other normal pregnancy, antepartum 11/05/2019 by StMalachy MoodMD No   Overview Addendum 06/04/2020  8:13 PM by StMalachy MoodMD    [facility] P[facility]atal [facility]  Dating LMP = 9 week USKorealood type: O/Positive/-- (01/04 1540)   Genetic Screen Fragile-X neg, SMA neg, CF neg NIPS: Normal XX Antibody:Negative (01/04 1540)  Anatomic US[place]oreaormal Rubella: 2.89 (01/04 1540) Varicella: Immune  GTT    Third trimester: 103 RPR: Non Reactive (01/04 1540)   Rhogam             n/a HBsAg: Negative (01/04 1540)   TDaP vaccine 04/19/2020 Flu Shot: declines HIV: Non Reactive (01/04 1540)   Baby Food             Br                   GBJFH:[id number]             Pill Pap:10/18 normal, needs pp  CBB              No   CS/VBAC             n/a   Support Person            Previous Version       Gestational age appropriate obstetric precautions including but not limited to vaginal bleeding, contractions, leaking of fluid and fetal movement were reviewed in detail with the patient.    - IOL 8/26 orders placed  Return in about 1 week (around 06/20/2020) for ROB.  AnConan Bowens  Georgianne Fick, MD, [facility], [facility] 06/13/2020, 1:43 PM

## 2020-06-20 ENCOUNTER — Other Ambulatory Visit
Admission: RE | Admit: 2020-06-20 | Discharge: 2020-06-20 | Disposition: A | Discharge status: Home / Self Care | Payer: BC Managed Care – PPO | Source: Ambulatory Visit | Attending: Obstetrics and Gynecology | Admitting: Obstetrics and Gynecology

## 2020-06-20 ENCOUNTER — Ambulatory Visit (INDEPENDENT_AMBULATORY_CARE_PROVIDER_SITE_OTHER): Payer: BC Managed Care – PPO | Admitting: Obstetrics and Gynecology

## 2020-06-20 ENCOUNTER — Other Ambulatory Visit: Payer: Self-pay

## 2020-06-20 VITALS — BP 136/86 | Wt 184.0 lb

## 2020-06-20 DIAGNOSIS — Z3480 Encounter for supervision of other normal pregnancy, unspecified trimester: Secondary | ICD-10-CM

## 2020-06-20 DIAGNOSIS — Z01812 Encounter for preprocedural laboratory examination: Secondary | ICD-10-CM | POA: Insufficient documentation

## 2020-06-20 DIAGNOSIS — Z3483 Encounter for supervision of other normal pregnancy, third trimester: Secondary | ICD-10-CM

## 2020-06-20 DIAGNOSIS — B951 Streptococcus, group B, as the cause of diseases classified elsewhere: Secondary | ICD-10-CM

## 2020-06-20 DIAGNOSIS — Z20822 Contact with and (suspected) exposure to covid-19: Secondary | ICD-10-CM | POA: Insufficient documentation

## 2020-06-20 DIAGNOSIS — Z3A39 39 weeks gestation of pregnancy: Secondary | ICD-10-CM

## 2020-06-20 NOTE — Progress Notes (Signed)
Obstetric H&P   Chief Complaint: [place] scheduling  Prenatal Care Provider: [facility]  History of Present Illness: 34 y.o. G1P0000 4w4dby 06/23/2020, by Last Menstrual Period presenting for ROB.  +FM, no lof, no VB, irregular contractions.  Pregnancy has been uncomplicated to date.  Pregravid weight 154 lb (69.9 kg) Total Weight Gain 30 lb (13.6 kg)  pregnancy 1  Problems (from 09/17/19 to present)    Problem Noted Resolved   Positive GBS test 06/04/2020 by SMalachy Mood MD No   Supervision of other normal pregnancy, antepartum 11/05/2019 by SMalachy Mood MD No   Overview Addendum 06/04/2020  8:13 PM by SMalachy Mood MD    [facility] P[facility]atal Labs  Dating LMP = 9 week UKoreaBlood type: O/Positive/-- (01/04 1540)   Genetic Screen Fragile-X neg, SMA neg, CF neg NIPS: Normal XX Antibody:Negative (01/04 1540)  Anatomic UKoreaNormal Rubella: 2.89 (01/04 1540) Varicella: Immune  GTT    Third trimester: 103 RPR: Non Reactive (01/04 1540)   Rhogam             n/a HBsAg: Negative (01/04 1540)   TDaP vaccine 04/19/2020 Flu Shot: declines HIV: Non Reactive (01/04 1540)   Baby Food             Br                   GFIE:[id number] Contraception             Pill Pap:10/18 normal, needs pp  CBB              No   CS/VBAC             n/a   Support Person            Previous Version       Review of Systems: 10 point review of systems negative unless otherwise noted in HPI  Past Medical History: Patient Active Problem List   Diagnosis Date Noted  . Positive GBS test 06/04/2020  . Elevated blood pressure affecting pregnancy in third trimester, antepartum 04/19/2020  . HSV-1 infection 02/16/2020  . Supervision of other normal pregnancy, antepartum 11/05/2019    [facility]  Dating LMP = 9 week UKoreaBlood type: O/Positive/-- (01/04 1540)   Genetic Screen Fragile-X neg, SMA neg, CF neg NIPS: Normal XX Antibody:Negative (01/04 1540)  Anatomic UKoreaNormal Rubella: 2.89  (01/04 1540) Varicella: Immune  GTT    Third trimester: 103 RPR: Non Reactive (01/04 1540)   Rhogam             n/a HBsAg: Negative (01/04 1540)   TDaP vaccine 04/19/2020 Flu Shot: declines HIV: Non Reactive (01/04 1540)   Baby Food             Br                   GCZY:[id number] Contraception             Pill Pap:10/18 normal, needs pp  CBB              No   CS/VBAC             n/a   Support Person         . BRCA negative 10/12/2019    MyRisk negative 08/12/2019 and remaining lifetime risk 16.5%   . Allergy 08/01/2017  . Allergic rhinitis 05/10/2015  . Asthma, well controlled 05/10/2015  . Gastroesophageal reflux  disease 05/10/2015    Past Surgical History: Past Surgical History:  Procedure Laterality Date  . ESOPHAGOGASTRODUODENOSCOPY (EGD) WITH PROPOFOL N/A 09/03/2017   Procedure: ESOPHAGOGASTRODUODENOSCOPY (EGD) WITH PROPOFOL;  Surgeon: Lin Landsman, MD;  Location: [facility];  Service: Endoscopy;  Laterality: N/A;  . HERNIA REPAIR    . HYSTEROSCOPY WITH D & C N/A 06/24/2019   Procedure: DILATATION AND CURETTAGE /HYSTEROSCOPY;  Surgeon: Malachy Mood, MD;  Location: [facility];  Service: Gynecology;  Laterality: N/A;  . MOLE REMOVAL    . TONSILLECTOMY      Past Obstetric History: # 1 - Date: None, Sex: None, Weight: None, GA: None, Delivery: None, Apgar1: None, Apgar5: None, Living: None, Birth Comments: None   Past Gynecologic History:  Family History: Family History  Problem Relation Age of Onset  . GER disease Mother   . Irritable bowel syndrome Mother   . Hypertension Mother   . Pancreatic cancer Maternal Grandmother 61  . Ovarian cancer Paternal Grandmother 45  . Leukemia Paternal Grandfather   . Breast cancer Paternal Aunt 62       Genetic test negative    Social History: Social History   Socioeconomic History  . Marital status: Married    Spouse name: Jenny Reichmann  . Number of children: Not on file  . Years of education: Not on file  .  Highest education level: Not on file  Occupational History  . Not on file  Tobacco Use  . Smoking status: Never Smoker  . Smokeless tobacco: Never Used  Vaping Use  . Vaping Use: Never used  Substance and Sexual Activity  . Alcohol use: Yes    Comment: occasional  . Drug use: No  . Sexual activity: Yes    Birth control/protection: Pill  Other Topics Concern  . Not on file  Social History Narrative  . Not on file   Social Determinants of Health   Financial Resource Strain:   . Difficulty of Paying Living Expenses: Not on file  Food Insecurity:   . Worried About Charity fundraiser in the Last Year: Not on file  . Ran Out of Food in the Last Year: Not on file  Transportation Needs:   . Lack of Transportation (Medical): Not on file  . Lack of Transportation (Non-Medical): Not on file  Physical Activity:   . Days of Exercise per Week: Not on file  . Minutes of Exercise per Session: Not on file  Stress:   . Feeling of Stress : Not on file  Social Connections:   . Frequency of Communication with Friends and Family: Not on file  . Frequency of Social Gatherings with Friends and Family: Not on file  . Attends Religious Services: Not on file  . Active Member of Clubs or Organizations: Not on file  . Attends Archivist Meetings: Not on file  . Marital Status: Not on file  Intimate Partner Violence:   . Fear of Current or Ex-Partner: Not on file  . Emotionally Abused: Not on file  . Physically Abused: Not on file  . Sexually Abused: Not on file    Medications: Prior to Admission medications   Medication Sig Start Date End Date Taking? Authorizing Provider  albuterol (VENTOLIN HFA) 108 (90 Base) MCG/ACT inhaler Inhale 1-2 puffs into the lungs every 4 (four) hours as needed for wheezing or shortness of breath.    [provider]  esomeprazole (NEXIUM) 20 MG capsule Take 20 mg by mouth every morning.  [provider]  montelukast (SINGULAIR) 10 MG  tablet Take 10 mg by mouth at bedtime.  Patient not taking: Reported on 04/19/2020 07/02/18   [provider]  Prenatal Vit-Fe Fumarate-FA (PRENATAL MULTIVITAMIN) TABS tablet Take 1 tablet by mouth daily at 12 noon.    [provider]  triamcinolone (NASACORT ALLERGY 24HR) 55 MCG/ACT AERO nasal inhaler Place 2 sprays into the nose daily as needed (allergies).  Patient not taking: Reported on 04/19/2020    [provider]    Allergies: Allergies  Allergen Reactions  . Other Itching    WALNUTS  . Sulfa Antibiotics Hives    Physical Exam: Vitals: Blood pressure 136/86, weight 184 lb (83.5 kg), last menstrual period 09/17/2019.   FHT: 145  General: NAD HEENT: normocephalic, anicteric Pulmonary: No increased work of breathing Cardiovascular: RRR, distal pulses 2+ Abdomen: Gravid, non-tender Leopolds: vtx Genitourinary: 1/70/-2 Extremities: no edema, erythema, or tenderness Neurologic: Grossly intact Psychiatric: mood appropriate, affect full  Labs: No results found for this or any previous visit (from the past 24 hour(s)).  Assessment: 34 y.o. G1P0000 71w4dby 06/23/2020, by Last Menstrual Period ROB and IOL scheduling  Plan: 1) IOL - scheduled for 8/26  2) Fetus - +FHT  3) PNL - Blood type O/Positive/-- (01/04 1540) / Anti-bodyscreen Negative (01/04 1540) / Rubella 2.89 (01/04 1540) / Varicella Immune/ RPR Non Reactive (06/10 1005) / HBsAg Negative (01/04 1540) / HIV Non Reactive (06/10 1005)  GBS Positive/-- (08/05 0904)  4) Immunization History -  Immunization History  Administered Date(s) Administered  . Tdap 10/28/2005, 06/11/2006, 04/02/2017, 04/19/2020    5) Disposition - pending delivery  AMalachy Mood MD, [facility] 06/20/2020, 9:36 AM

## 2020-06-20 NOTE — Progress Notes (Signed)
ROB

## 2020-06-21 LAB — SARS CORONAVIRUS 2 (TAT 6-24 HRS): SARS Coronavirus 2: NEGATIVE

## 2020-06-22 ENCOUNTER — Inpatient Hospital Stay: Payer: BC Managed Care – PPO | Admitting: Anesthesiology

## 2020-06-22 ENCOUNTER — Other Ambulatory Visit: Payer: Self-pay

## 2020-06-22 ENCOUNTER — Encounter: Payer: Self-pay | Admitting: Obstetrics and Gynecology

## 2020-06-22 ENCOUNTER — Inpatient Hospital Stay
Admission: RE | Admit: 2020-06-22 | Discharge: 2020-06-25 | DRG: 806 | Disposition: A | Discharge status: Home / Self Care | Payer: BC Managed Care – PPO | Attending: Certified Nurse Midwife | Admitting: Certified Nurse Midwife

## 2020-06-22 DIAGNOSIS — O9081 Anemia of the puerperium: Secondary | ICD-10-CM | POA: Diagnosis not present

## 2020-06-22 DIAGNOSIS — Z3A39 39 weeks gestation of pregnancy: Secondary | ICD-10-CM

## 2020-06-22 DIAGNOSIS — Z20822 Contact with and (suspected) exposure to covid-19: Secondary | ICD-10-CM | POA: Diagnosis present

## 2020-06-22 DIAGNOSIS — D62 Acute posthemorrhagic anemia: Secondary | ICD-10-CM | POA: Diagnosis not present

## 2020-06-22 DIAGNOSIS — Z3480 Encounter for supervision of other normal pregnancy, unspecified trimester: Secondary | ICD-10-CM

## 2020-06-22 DIAGNOSIS — Z3A40 40 weeks gestation of pregnancy: Secondary | ICD-10-CM | POA: Diagnosis not present

## 2020-06-22 DIAGNOSIS — Z3490 Encounter for supervision of normal pregnancy, unspecified, unspecified trimester: Secondary | ICD-10-CM | POA: Diagnosis present

## 2020-06-22 DIAGNOSIS — R0789 Other chest pain: Secondary | ICD-10-CM | POA: Diagnosis not present

## 2020-06-22 DIAGNOSIS — B951 Streptococcus, group B, as the cause of diseases classified elsewhere: Secondary | ICD-10-CM | POA: Diagnosis not present

## 2020-06-22 DIAGNOSIS — O99824 Streptococcus B carrier state complicating childbirth: Secondary | ICD-10-CM | POA: Diagnosis present

## 2020-06-22 DIAGNOSIS — M25559 Pain in unspecified hip: Secondary | ICD-10-CM | POA: Diagnosis not present

## 2020-06-22 DIAGNOSIS — O99893 Other specified diseases and conditions complicating puerperium: Secondary | ICD-10-CM | POA: Diagnosis not present

## 2020-06-22 DIAGNOSIS — O26893 Other specified pregnancy related conditions, third trimester: Secondary | ICD-10-CM | POA: Diagnosis present

## 2020-06-22 DIAGNOSIS — O9882 Other maternal infectious and parasitic diseases complicating childbirth: Secondary | ICD-10-CM | POA: Diagnosis not present

## 2020-06-22 LAB — TYPE AND SCREEN
ABO/RH(D): O POS
Antibody Screen: NEGATIVE

## 2020-06-22 LAB — COMPREHENSIVE METABOLIC PANEL
ALT: 22 U/L (ref 0–44)
AST: 26 U/L (ref 15–41)
Albumin: 3.2 g/dL — ABNORMAL LOW (ref 3.5–5.0)
Alkaline Phosphatase: 249 U/L — ABNORMAL HIGH (ref 38–126)
Anion gap: 12 (ref 5–15)
BUN: 9 mg/dL (ref 6–20)
CO2: 21 mmol/L — ABNORMAL LOW (ref 22–32)
Calcium: 8.9 mg/dL (ref 8.9–10.3)
Chloride: 102 mmol/L (ref 98–111)
Creatinine, Ser: 0.79 mg/dL (ref 0.44–1.00)
GFR calc Af Amer: >60 mL/min (ref >60)
GFR calc non Af Amer: >60 mL/min (ref >60)
Glucose, Bld: 84 mg/dL (ref 70–99)
Potassium: 4.4 mmol/L (ref 3.5–5.1)
Sodium: 135 mmol/L (ref 135–145)
Total Bilirubin: 0.6 mg/dL (ref 0.3–1.2)
Total Protein: 6.6 g/dL (ref 6.5–8.1)

## 2020-06-22 LAB — CBC
HCT: 36.4 % (ref 36.0–46.0)
Hemoglobin: 12 g/dL (ref 12.0–15.0)
MCH: 27.5 pg (ref 26.0–34.0)
MCHC: 33 g/dL (ref 30.0–36.0)
MCV: 83.5 fL (ref 80.0–100.0)
Platelets: 234 10*3/uL (ref 150–400)
RBC: 4.36 MIL/uL (ref 3.87–5.11)
RDW: 15.2 % (ref 11.5–15.5)
WBC: 9.4 10*3/uL (ref 4.0–10.5)
nRBC: 0 % (ref 0.0–0.2)

## 2020-06-22 LAB — ABO/RH: ABO/RH(D): O POS

## 2020-06-22 LAB — PROTEIN / CREATININE RATIO, URINE
Creatinine, Urine: 187 mg/dL
Protein Creatinine Ratio: 0.08 mg/mg{Cre} (ref 0.00–0.15)
Total Protein, Urine: 15 mg/dL

## 2020-06-22 MED ORDER — OXYTOCIN-SODIUM CHLORIDE 30-0.9 UT/500ML-% IV SOLN
2.5000 [IU]/h | INTRAVENOUS | Status: DC
  Filled 2020-06-22: qty 500

## 2020-06-22 MED ORDER — EPHEDRINE 5 MG/ML INJ: 10.0000 mg | INTRAVENOUS | Status: DC | PRN

## 2020-06-22 MED ORDER — LIDOCAINE HCL (PF) 1 % IJ SOLN
INTRAMUSCULAR | Status: AC
  Filled 2020-06-22: qty 30

## 2020-06-22 MED ORDER — MISOPROSTOL 25 MCG QUARTER TABLET
25.0000 ug | ORAL_TABLET | ORAL | Status: DC | PRN
  Administered 2020-06-22: 25 ug via VAGINAL
  Filled 2020-06-22: qty 1

## 2020-06-22 MED ORDER — ONDANSETRON HCL 4 MG/2ML IJ SOLN: 4.0000 mg | Freq: Four times a day (QID) | INTRAMUSCULAR | Status: DC | PRN

## 2020-06-22 MED ORDER — PHENYLEPHRINE 40 MCG/ML (10ML) SYRINGE FOR IV PUSH (FOR BLOOD PRESSURE SUPPORT): 80.0000 ug | PREFILLED_SYRINGE | INTRAVENOUS | Status: DC | PRN

## 2020-06-22 MED ORDER — PENICILLIN G POT IN DEXTROSE 60000 UNIT/ML IV SOLN
3.0000 10*6.[IU] | INTRAVENOUS | Status: DC
  Administered 2020-06-22: 3 10*6.[IU] via INTRAVENOUS
  Filled 2020-06-22: qty 50

## 2020-06-22 MED ORDER — CEFAZOLIN (ANCEF) 1 G IV SOLR: 1.0000 g | Freq: Three times a day (TID) | INTRAVENOUS | Status: DC

## 2020-06-22 MED ORDER — CEFAZOLIN SODIUM-DEXTROSE 1-4 GM/50ML-% IV SOLN
1.0000 g | Freq: Three times a day (TID) | INTRAVENOUS | Status: DC
  Administered 2020-06-23: 1 g via INTRAVENOUS
  Filled 2020-06-22: qty 50

## 2020-06-22 MED ORDER — TERBUTALINE SULFATE 1 MG/ML IJ SOLN: 0.2500 mg | Freq: Once | INTRAMUSCULAR | Status: DC | PRN

## 2020-06-22 MED ORDER — BUTORPHANOL TARTRATE 1 MG/ML IJ SOLN: 1.0000 mg | INTRAMUSCULAR | Status: DC | PRN

## 2020-06-22 MED ORDER — LACTATED RINGERS IV SOLN: 500.0000 mL | INTRAVENOUS | Status: DC | PRN

## 2020-06-22 MED ORDER — LIDOCAINE-EPINEPHRINE (PF) 1.5 %-1:200000 IJ SOLN
INTRAMUSCULAR | Status: DC | PRN
  Administered 2020-06-22: 4 mL via EPIDURAL

## 2020-06-22 MED ORDER — LACTATED RINGERS IV SOLN: INTRAVENOUS | Status: DC

## 2020-06-22 MED ORDER — DIPHENHYDRAMINE HCL 50 MG/ML IJ SOLN
25.0000 mg | Freq: Once | INTRAMUSCULAR | Status: AC
  Administered 2020-06-22: 25 mg via INTRAVENOUS
  Filled 2020-06-22: qty 1

## 2020-06-22 MED ORDER — ACETAMINOPHEN 325 MG PO TABS
650.0000 mg | ORAL_TABLET | ORAL | Status: DC | PRN
  Administered 2020-06-23: 650 mg via ORAL
  Filled 2020-06-22: qty 2

## 2020-06-22 MED ORDER — LACTATED RINGERS IV SOLN: 500.0000 mL | Freq: Once | INTRAVENOUS | Status: DC

## 2020-06-22 MED ORDER — AMMONIA AROMATIC IN INHA
RESPIRATORY_TRACT | Status: AC
  Filled 2020-06-22: qty 10

## 2020-06-22 MED ORDER — SOD CITRATE-CITRIC ACID 500-334 MG/5ML PO SOLN: 30.0000 mL | ORAL | Status: DC | PRN

## 2020-06-22 MED ORDER — BUPIVACAINE HCL (PF) 0.25 % IJ SOLN
INTRAMUSCULAR | Status: DC | PRN
  Administered 2020-06-22: 3 mL via EPIDURAL
  Administered 2020-06-22: 5 mL via EPIDURAL

## 2020-06-22 MED ORDER — MISOPROSTOL 200 MCG PO TABS
ORAL_TABLET | ORAL | Status: AC
  Filled 2020-06-22: qty 4

## 2020-06-22 MED ORDER — OXYTOCIN BOLUS FROM INFUSION
333.0000 mL | Freq: Once | INTRAVENOUS | Status: AC
  Administered 2020-06-23: 333 mL via INTRAVENOUS

## 2020-06-22 MED ORDER — FENTANYL 2.5 MCG/ML W/ROPIVACAINE 0.15% IN NS 100 ML EPIDURAL (ARMC)
12.0000 mL/h | EPIDURAL | Status: DC
  Administered 2020-06-22: 12 mL/h via EPIDURAL
  Filled 2020-06-22: qty 100

## 2020-06-22 MED ORDER — LIDOCAINE HCL (PF) 1 % IJ SOLN
INTRAMUSCULAR | Status: DC | PRN
  Administered 2020-06-22: 3 mL via SUBCUTANEOUS

## 2020-06-22 MED ORDER — LIDOCAINE HCL (PF) 1 % IJ SOLN: 30.0000 mL | INTRAMUSCULAR | Status: DC | PRN

## 2020-06-22 MED ORDER — SODIUM CHLORIDE 0.9 % IV SOLN
5.0000 10*6.[IU] | Freq: Once | INTRAVENOUS | Status: AC
  Administered 2020-06-22: 5 10*6.[IU] via INTRAVENOUS
  Filled 2020-06-22: qty 5

## 2020-06-22 MED ORDER — OXYTOCIN-SODIUM CHLORIDE 30-0.9 UT/500ML-% IV SOLN
1.0000 m[IU]/min | INTRAVENOUS | Status: DC
  Administered 2020-06-23: 2 m[IU]/min via INTRAVENOUS

## 2020-06-22 MED ORDER — FENTANYL 2.5 MCG/ML W/ROPIVACAINE 0.15% IN NS 100 ML EPIDURAL (ARMC)
EPIDURAL | Status: AC
Start: 2020-06-22 — End: 2020-06-23
  Filled 2020-06-22: qty 100

## 2020-06-22 MED ORDER — DIPHENHYDRAMINE HCL 50 MG/ML IJ SOLN: 12.5000 mg | INTRAMUSCULAR | Status: DC | PRN

## 2020-06-22 MED ORDER — OXYTOCIN 10 UNIT/ML IJ SOLN
INTRAMUSCULAR | Status: AC
  Filled 2020-06-22: qty 2

## 2020-06-22 NOTE — Progress Notes (Signed)
Pt. Reports burning, facial flushing, and "not feeling right" with second dose of IV PCN. The medication was stopped and MD made aware. Pt. Given IV Benadryl and IV tubing was changing. Pt reported feeling better after medication was stopped.

## 2020-06-22 NOTE — Progress Notes (Signed)
Erika Moore is a 34 y.o. G1P0000 at [redacted]w[redacted]d by ultrasound admitted for induction of labor due to Elective at term and some periodic HTN. She has been using the birth ball, and reports feeling occasional cramping and abdominal tightening. She denies any LOF or vaginal bleeding..  Subjective: she is amenable to foley ball placement. Would likel to approach her induction using less pitocin,if possible.   Objective: BP 129/90 (BP Location: Left Arm)   Pulse 89   Temp 97.9 F (36.6 C) (Oral)   Resp 19   Ht 5\' 3"  (1.6 m)   Wt 83.5 kg   LMP 09/17/2019   BMI 32.59 kg/m  No intake/output data recorded. No intake/output data recorded.  FHT:  FHR: 135 baseline bpm, variability: moderate,  accelerations:  Present,  decelerations:  Absent UC:   Inifrequent, although not tracing well per EFM. SVE:   Dilation: 2 Effacement (%): 80 Station: -2 Exam by:: Lynett Fish CNM  Labs: Lab Results  Component Value Date   WBC 9.4 06/22/2020   HGB 12.0 06/22/2020   HCT 36.4 06/22/2020   MCV 83.5 06/22/2020   PLT 234 06/22/2020    Assessment / Plan: Induction of labor due to term with favorable cervix and some elevated BPs,  progressing well on pitocin  Labor: Progressing normally  With cervical ripening. Bishop score of 7  Fetal Wellbeing:  Category I Pain Control:  Labor support without medications I/D:  n/a Anticipated MOD:  NSVD  Foley ball placed with relative ease. Infused with 35 ccs of sterile water. Patient tolerated this well. Anticipate the start of pitocin later this afternoon. Text report to Dr. Georgianne Fick who is assuming care.  Imagene Riches, CNM  06/22/2020 12:41 PM    Imagene Riches 06/22/2020, 12:35 PM

## 2020-06-22 NOTE — Progress Notes (Signed)
Call received from M. Rolly Salter, CNM start the penicillin now. VORB and will notify patient on plan of care. See orders

## 2020-06-22 NOTE — Progress Notes (Signed)
   Subjective:  No concerns.  Feeling some contractions.  Foley bulb out.    Objective:   Vitals: Blood pressure 132/90, pulse 87, temperature 97.9 F (36.6 C), temperature source Oral, resp. rate 16, height 5\' 3"  (1.6 m), weight 83.5 kg, last menstrual period 09/17/2019. General: NAD Abdomen: gravid, non-tender Cervical Exam:  Dilation: 4 Effacement (%): 80 Cervical Position: Posterior Station: -2 Presentation: Vertex Exam by:: Adabelle Griffiths MD  FHT: 130, moderate variability, +accels, no decels Toco: q5-87min  Results for orders placed or performed during the hospital encounter of 06/22/20 (from the past 24 hour(s))  Protein / creatinine ratio, urine     Status: None   Collection Time: 06/22/20  8:39 AM  Result Value Ref Range   Creatinine, Urine 187 mg/dL   Total Protein, Urine 15 mg/dL   Protein Creatinine Ratio 0.08 0.00 - 0.15 mg/mg[Cre]  CBC     Status: None   Collection Time: 06/22/20  8:40 AM  Result Value Ref Range   WBC 9.4 4.0 - 10.5 K/uL   RBC 4.36 3.87 - 5.11 MIL/uL   Hemoglobin 12.0 12.0 - 15.0 g/dL   HCT 36.4 36 - 46 %   MCV 83.5 80.0 - 100.0 fL   MCH 27.5 26.0 - 34.0 pg   MCHC 33.0 30.0 - 36.0 g/dL   RDW 15.2 11.5 - 15.5 %   Platelets 234 150 - 400 K/uL   nRBC 0.0 0.0 - 0.2 %  Comprehensive metabolic panel     Status: Abnormal   Collection Time: 06/22/20 10:01 AM  Result Value Ref Range   Sodium 135 135 - 145 mmol/L   Potassium 4.4 3.5 - 5.1 mmol/L   Chloride 102 98 - 111 mmol/L   CO2 21 (L) 22 - 32 mmol/L   Glucose, Bld 84 70 - 99 mg/dL   BUN 9 6 - 20 mg/dL   Creatinine, Ser 0.79 0.44 - 1.00 mg/dL   Calcium 8.9 8.9 - 10.3 mg/dL   Total Protein 6.6 6.5 - 8.1 g/dL   Albumin 3.2 (L) 3.5 - 5.0 g/dL   AST 26 15 - 41 U/L   ALT 22 0 - 44 U/L   Alkaline Phosphatase 249 (H) 38 - 126 U/L   Total Bilirubin 0.6 0.3 - 1.2 mg/dL   GFR calc non Af Amer >60 >60 mL/min   GFR calc Af Amer >60 >60 mL/min   Anion gap 12 5 - 15  Type and screen Ordered by ORDER LAB      Status: None   Collection Time: 06/22/20 10:01 AM  Result Value Ref Range   ABO/RH(D) O POS    Antibody Screen NEG    Sample Expiration      06/25/2020,2359 Performed at [facility], [address] [place], [place] [address]   ABO/Rh     Status: None   Collection Time: 06/22/20 12:06 PM  Result Value Ref Range   ABO/RH(D)      O POS Performed at [facility], [address] [place], [place] [address]     Assessment:   34 y.o. G1P0000 [redacted]w[redacted]d IOL  Plan:   1) Labor - AROM clear, monitor contractions start pitocin if contractions space out  2) Fetus - cat I tracing   Malachy Mood, MD, [place], [facility] 06/22/2020, 5:36 PM

## 2020-06-22 NOTE — Anesthesia Procedure Notes (Signed)
Epidural Patient location during procedure: OB  Staffing Performed: anesthesiologist   Preanesthetic Checklist Completed: patient identified, IV checked, site marked, risks and benefits discussed, surgical consent, monitors and equipment checked, pre-op evaluation and timeout performed  Epidural Patient position: sitting Prep: Betadine Patient monitoring: heart rate, continuous pulse ox and blood pressure Approach: midline Location: L3-L4 Injection technique: LOR saline  Needle:  Needle type: Tuohy  Needle gauge: 17 G Needle length: 9 cm and 9 Needle insertion depth: 4 cm Catheter type: closed end flexible Catheter size: 19 Gauge Catheter at skin depth: 10 cm Test dose: negative and 1.5% lidocaine with Epi 1:200 K  Assessment Sensory level: T10 Events: blood not aspirated, injection not painful, no injection resistance, no paresthesia and negative IV test  Additional Notes   Patient tolerated the insertion well without complications.-SATD -IVTD. No paresthesia. Refer to [place] for VS and dosingReason for block:procedure for pain

## 2020-06-22 NOTE — H&P (Signed)
Erika Moore is a 34 y.o. female presenting for scheduled induction of labor. She is a Gravida 1 para 0, now 39 weeks 6 days gestation, with some earlier work up for HTN this pregnancy..she met with Dr. Georgianne Fick two days ago in the office and a decision was made to set her up for IOL today. Her husband is with her and is supportive. This couple has taken Childbirth Ed classes this pregnancy. She denies any headache, blurred vision, but does report some leg edema. Her baby is moving well. OB History    Gravida  1   Para  0   Term  0   Preterm  0   AB  0   Living  0     SAB  0   TAB  0   Ectopic  0   Multiple  0   Live Births  0          Past Medical History:  Diagnosis Date  . Allergic   . Asthma   . BRCA negative 08/2019   MyRisk neg  . Family history of breast cancer 07/2019  . Family history of ovarian cancer 08/2019   MyRisk neg  . GERD (gastroesophageal reflux disease)   . HSV (herpes simplex virus) infection   . Immunization, viral disease    Gardasil vaccine completed  . Increased risk of breast cancer 08/2019   IBIS=20.2%/riskscore=16.5%  . TMJ (dislocation of temporomandibular joint)    Past Surgical History:  Procedure Laterality Date  . ESOPHAGOGASTRODUODENOSCOPY (EGD) WITH PROPOFOL N/A 09/03/2017   Procedure: ESOPHAGOGASTRODUODENOSCOPY (EGD) WITH PROPOFOL;  Surgeon: Lin Landsman, MD;  Location: [facility];  Service: Endoscopy;  Laterality: N/A;  . HERNIA REPAIR    . HYSTEROSCOPY WITH D & C N/A 06/24/2019   Procedure: DILATATION AND CURETTAGE /HYSTEROSCOPY;  Surgeon: Malachy Mood, MD;  Location: [facility];  Service: Gynecology;  Laterality: N/A;  . MOLE REMOVAL    . TONSILLECTOMY     Family History: family history includes Breast cancer (age of onset: 66) in her paternal aunt; GER disease in her mother; Hypertension in her mother; Irritable bowel syndrome in her mother; Leukemia in her paternal grandfather; Ovarian cancer (age of  onset: 17) in her paternal grandmother; Pancreatic cancer (age of onset: 71) in her maternal grandmother. Social History:  reports that she has never smoked. She has never used smokeless tobacco. She reports current alcohol use. She reports that she does not use drugs.     Maternal Diabetes: No Genetic Screening: Normal Maternal Ultrasounds/Referrals: Normal Fetal Ultrasounds or other Referrals:  None Maternal Substance Abuse:  No Significant Maternal Medications:  None Significant Maternal Lab Results:  Group B Strep positive Other Comments:  None  Review of Systems  Constitutional: Negative.   HENT: Negative.   Eyes: Negative.   Respiratory: Negative.   Cardiovascular: Positive for leg swelling.  Gastrointestinal: Negative.   Endocrine: Negative.   Genitourinary: Negative.   Musculoskeletal: Negative.   Skin: Negative.   Allergic/Immunologic: Negative.   Neurological: Negative.   Hematological: Negative.   Psychiatric/Behavioral: Negative.    History Dilation: 1.5 Effacement (%): 80 Station: -2 Exam by:: Gigi Gin CNM Blood pressure 129/90, pulse 89, temperature 97.9 F (36.6 C), temperature source Oral, resp. rate 19, height $RemoveBe'5\' 3"'UWSEmsIOw$  (1.6 m), weight 83.5 kg, last menstrual period 09/17/2019. Maternal Exam:  Uterine Assessment: Contraction strength is mild.  Contraction frequency is rare.   Abdomen: Patient reports no abdominal tenderness. Fundal height is 36.  Estimated fetal weight is 7lbs.   Fetal presentation: vertex  Introitus: Normal vulva. Normal vagina.  Ferning test: not done.  Nitrazine test: not done.  Pelvis: adequate for delivery.   Cervix: Cervix evaluated by digital exam.     Physical Exam Constitutional:      Appearance: Normal appearance.  HENT:     Head: Normocephalic and atraumatic.     Mouth/Throat:     Mouth: Mucous membranes are moist.     Pharynx: Oropharynx is clear.  Eyes:     Extraocular Movements: Extraocular movements  intact.     Pupils: Pupils are equal, round, and reactive to light.  Cardiovascular:     Rate and Rhythm: Normal rate and regular rhythm.     Pulses: Normal pulses.     Heart sounds: Normal heart sounds.  Pulmonary:     Effort: Pulmonary effort is normal.     Breath sounds: Normal breath sounds.  Abdominal:     General: Bowel sounds are normal.     Palpations: Abdomen is soft.  Genitourinary:    General: Normal vulva.  Musculoskeletal:     Cervical back: Normal range of motion and neck supple.     Right lower leg: Edema present.     Left lower leg: Edema present.  Skin:    General: Skin is warm and dry.  Neurological:     General: No focal deficit present.     Mental Status: She is alert and oriented to person, place, and time.  Psychiatric:        Mood and Affect: Mood normal.        Behavior: Behavior normal.     Prenatal labs: ABO, Rh: O/Positive/-- (01/04 1540) Antibody: Negative (01/04 1540) Rubella: 2.89 (01/04 1540) RPR: Non Reactive (06/10 1005)  HBsAg: Negative (01/04 1540)  HIV: Non Reactive (06/10 1005)  GBS: Positive/-- (08/05 7782)   Assessment/Plan: Admitted to labor and Delivery for Induction of labor at 39 weeks 6 days. Routine labs and PIH panel ( some elevated BPS recently) Continuous EFM Cytotec 25 mcg placed vaginally. Anticipate Pitocin infusion per protocol this afternoon. Careful description of IOL process, including the use of Cytotec, foley ball and pitocin. Patient has a preference for less "aggressive" induction process- opts for Cytotec vs foley to start. Understands that pitocin use is likely. Cervix is swept thoroughly prior to Cytotec placement.  Imagene Riches, CNM  06/22/2020 10:33 AM     Joycelyn Schmid Lynett Fish 06/22/2020, 10:29 AM

## 2020-06-22 NOTE — Progress Notes (Signed)
Patient reported more burning and facial flushing with second PCN dose.  No history of PCN allergy.  Will switch from PCN to Ancef.  Has had two doses of PCN so adequately treated.  Will start Ancef 4-hrs after this last PCN dose with q8hrs dosing

## 2020-06-22 NOTE — Progress Notes (Signed)
   Subjective:  Doing well, just got epidural because of strengthening contractions  Objective:   Vitals: Blood pressure 117/62, pulse 82, temperature 97.9 F (36.6 C), temperature source Oral, resp. rate 19, height 5\' 3"  (1.6 m), weight 83.5 kg, last menstrual period 09/17/2019, SpO2 100 %. General: NAD Abdomen: gravid, non-tender Cervical Exam:  Dilation: 5 Effacement (%): 100 Cervical Position: Posterior Station: -1 Presentation: Vertex Exam by:: Macie Burows, MD  FHT: 130, moderate, +accels, no decels Toco:q55min  Results for orders placed or performed during the hospital encounter of 06/22/20 (from the past 24 hour(s))  Protein / creatinine ratio, urine     Status: None   Collection Time: 06/22/20  8:39 AM  Result Value Ref Range   Creatinine, Urine 187 mg/dL   Total Protein, Urine 15 mg/dL   Protein Creatinine Ratio 0.08 0.00 - 0.15 mg/mg[Cre]  CBC     Status: None   Collection Time: 06/22/20  8:40 AM  Result Value Ref Range   WBC 9.4 4.0 - 10.5 K/uL   RBC 4.36 3.87 - 5.11 MIL/uL   Hemoglobin 12.0 12.0 - 15.0 g/dL   HCT 36.4 36 - 46 %   MCV 83.5 80.0 - 100.0 fL   MCH 27.5 26.0 - 34.0 pg   MCHC 33.0 30.0 - 36.0 g/dL   RDW 15.2 11.5 - 15.5 %   Platelets 234 150 - 400 K/uL   nRBC 0.0 0.0 - 0.2 %  Comprehensive metabolic panel     Status: Abnormal   Collection Time: 06/22/20 10:01 AM  Result Value Ref Range   Sodium 135 135 - 145 mmol/L   Potassium 4.4 3.5 - 5.1 mmol/L   Chloride 102 98 - 111 mmol/L   CO2 21 (L) 22 - 32 mmol/L   Glucose, Bld 84 70 - 99 mg/dL   BUN 9 6 - 20 mg/dL   Creatinine, Ser 0.79 0.44 - 1.00 mg/dL   Calcium 8.9 8.9 - 10.3 mg/dL   Total Protein 6.6 6.5 - 8.1 g/dL   Albumin 3.2 (L) 3.5 - 5.0 g/dL   AST 26 15 - 41 U/L   ALT 22 0 - 44 U/L   Alkaline Phosphatase 249 (H) 38 - 126 U/L   Total Bilirubin 0.6 0.3 - 1.2 mg/dL   GFR calc non Af Amer >60 >60 mL/min   GFR calc Af Amer >60 >60 mL/min   Anion gap 12 5 - 15  Type and screen Ordered by  ORDER LAB     Status: None   Collection Time: 06/22/20 10:01 AM  Result Value Ref Range   ABO/RH(D) O POS    Antibody Screen NEG    Sample Expiration      06/25/2020,2359 Performed at [facility], [address] [place], [place] [address]   ABO/Rh     Status: None   Collection Time: 06/22/20 12:06 PM  Result Value Ref Range   ABO/RH(D)      O POS Performed at [facility], [address], [place], [place] [address]     Assessment:   34 y.o. G1P0000 [redacted]w[redacted]d IOL  Plan:   1) Labor - continue expectant management, making cervical change spontaneously following AROM   2) Fetus - cat I tracing  Malachy Mood, MD, [place], [facility] 06/22/2020, 9:33 PM

## 2020-06-22 NOTE — Anesthesia Preprocedure Evaluation (Signed)
Anesthesia Evaluation  Patient identified by MRN, date of birth, ID band Patient awake    Reviewed: Allergy & Precautions, H&P , NPO status , Patient's Chart, lab work & pertinent test results, reviewed documented beta blocker date and time   Airway Mallampati: II  TM Distance: >3 FB Neck ROM: full    Dental no notable dental hx. (+) Teeth Intact   Pulmonary asthma , Current Smoker,    Pulmonary exam normal breath sounds clear to auscultation       Cardiovascular Exercise Tolerance: Good negative cardio ROS   Rhythm:regular Rate:Normal     Neuro/Psych negative neurological ROS  negative psych ROS   GI/Hepatic Neg liver ROS, GERD  Medicated,  Endo/Other  negative endocrine ROSdiabetes, Well Controlled, Gestational  Renal/GU      Musculoskeletal   Abdominal   Peds  Hematology negative hematology ROS (+)   Anesthesia Other Findings   Reproductive/Obstetrics (+) Pregnancy                             Anesthesia Physical Anesthesia Plan  ASA: II  Anesthesia Plan: Epidural   Post-op Pain Management:    Induction:   PONV Risk Score and Plan:   Airway Management Planned:   Additional Equipment:   Intra-op Plan:   Post-operative Plan:   Informed Consent: I have reviewed the patients History and Physical, chart, labs and discussed the procedure including the risks, benefits and alternatives for the proposed anesthesia with the patient or authorized representative who has indicated his/her understanding and acceptance.       Plan Discussed with:   Anesthesia Plan Comments:         Anesthesia Quick Evaluation

## 2020-06-23 ENCOUNTER — Encounter: Payer: Self-pay | Admitting: Obstetrics and Gynecology

## 2020-06-23 DIAGNOSIS — O9882 Other maternal infectious and parasitic diseases complicating childbirth: Secondary | ICD-10-CM

## 2020-06-23 DIAGNOSIS — Z3A40 40 weeks gestation of pregnancy: Secondary | ICD-10-CM

## 2020-06-23 DIAGNOSIS — B951 Streptococcus, group B, as the cause of diseases classified elsewhere: Secondary | ICD-10-CM

## 2020-06-23 LAB — RPR: RPR Ser Ql: NONREACTIVE

## 2020-06-23 MED ORDER — COCONUT OIL OIL
1.0000 "application " | TOPICAL_OIL | Status: DC | PRN
  Administered 2020-06-23: 1 via TOPICAL
  Filled 2020-06-23: qty 120

## 2020-06-23 MED ORDER — OXYCODONE-ACETAMINOPHEN 5-325 MG PO TABS: 2.0000 | ORAL_TABLET | ORAL | Status: DC | PRN

## 2020-06-23 MED ORDER — SENNOSIDES-DOCUSATE SODIUM 8.6-50 MG PO TABS
2.0000 | ORAL_TABLET | ORAL | Status: DC
  Filled 2020-06-23: qty 2

## 2020-06-23 MED ORDER — ALBUTEROL SULFATE (2.5 MG/3ML) 0.083% IN NEBU: 2.5000 mg | INHALATION_SOLUTION | RESPIRATORY_TRACT | Status: DC | PRN

## 2020-06-23 MED ORDER — OXYCODONE-ACETAMINOPHEN 5-325 MG PO TABS: 1.0000 | ORAL_TABLET | ORAL | Status: DC | PRN

## 2020-06-23 MED ORDER — OXYTOCIN-SODIUM CHLORIDE 30-0.9 UT/500ML-% IV SOLN
INTRAVENOUS | Status: AC
  Administered 2020-06-23: 2.5 [IU]/h via INTRAVENOUS
  Filled 2020-06-23: qty 500

## 2020-06-23 MED ORDER — WITCH HAZEL-GLYCERIN EX PADS
1.0000 "application " | MEDICATED_PAD | CUTANEOUS | Status: DC | PRN
  Administered 2020-06-23: 1 via TOPICAL
  Filled 2020-06-23: qty 100

## 2020-06-23 MED ORDER — BENZOCAINE-MENTHOL 20-0.5 % EX AERO
1.0000 "application " | INHALATION_SPRAY | CUTANEOUS | Status: DC | PRN
  Administered 2020-06-23: 1 via TOPICAL
  Filled 2020-06-23: qty 56

## 2020-06-23 MED ORDER — PANTOPRAZOLE SODIUM 40 MG PO TBEC
40.0000 mg | DELAYED_RELEASE_TABLET | Freq: Every day | ORAL | Status: DC
  Administered 2020-06-25: 40 mg via ORAL
  Filled 2020-06-23: qty 1

## 2020-06-23 MED ORDER — ALBUTEROL SULFATE HFA 108 (90 BASE) MCG/ACT IN AERS: 1.0000 | INHALATION_SPRAY | RESPIRATORY_TRACT | Status: DC | PRN

## 2020-06-23 MED ORDER — DIBUCAINE (PERIANAL) 1 % EX OINT
1.0000 "application " | TOPICAL_OINTMENT | CUTANEOUS | Status: DC | PRN
  Administered 2020-06-23: 1 via RECTAL
  Filled 2020-06-23: qty 28

## 2020-06-23 MED ORDER — ONDANSETRON HCL 4 MG PO TABS
4.0000 mg | ORAL_TABLET | ORAL | Status: DC | PRN
  Filled 2020-06-23: qty 1

## 2020-06-23 MED ORDER — SIMETHICONE 80 MG PO CHEW
80.0000 mg | CHEWABLE_TABLET | ORAL | Status: DC | PRN
  Administered 2020-06-23: 80 mg via ORAL
  Filled 2020-06-23: qty 1

## 2020-06-23 MED ORDER — IBUPROFEN 600 MG PO TABS
600.0000 mg | ORAL_TABLET | Freq: Four times a day (QID) | ORAL | Status: DC
  Administered 2020-06-23 – 2020-06-25 (×8): 600 mg via ORAL
  Filled 2020-06-23 (×8): qty 1

## 2020-06-23 MED ORDER — ACETAMINOPHEN 325 MG PO TABS
650.0000 mg | ORAL_TABLET | ORAL | Status: DC | PRN
  Administered 2020-06-23 – 2020-06-25 (×2): 650 mg via ORAL
  Filled 2020-06-23 (×2): qty 2

## 2020-06-23 MED ORDER — PRENATAL MULTIVITAMIN CH
1.0000 | ORAL_TABLET | Freq: Every day | ORAL | Status: DC
  Administered 2020-06-23 – 2020-06-24 (×2): 1 via ORAL
  Filled 2020-06-23 (×3): qty 1

## 2020-06-23 MED ORDER — DIPHENHYDRAMINE HCL 25 MG PO CAPS: 25.0000 mg | ORAL_CAPSULE | Freq: Four times a day (QID) | ORAL | Status: DC | PRN

## 2020-06-23 MED ORDER — ONDANSETRON HCL 4 MG/2ML IJ SOLN: 4.0000 mg | INTRAMUSCULAR | Status: DC | PRN

## 2020-06-23 NOTE — Lactation Note (Signed)
This note was copied from a baby's chart. Lactation Consultation Note  Patient Name: Erika Moore [id number] Date: 06/23/2020 Reason for consult: Initial assessment;Mother's request;Primapara;Term;Other (Comment) (Firm areola & breasts, but compressible for latch)  When went into mom's room on mother/baby, FOB was holding [name] and she was rooting and licking her tongue out.  She had just breast fed on the left breast an hour before.  Pointed out feeding cues to parents and encouraged to put her to the breast whenever she demonstrated hunger cues.  Mom positioned her in football hold on boppy pillow skin to skin.  Mom's areola and breasts are firm, but compressible for latch.  She latched with minimal assistance and began good rhythmic sucking which mom could feel as strong tugs.  Mom reports tenderness on latch, but said it hurt more on the right breast.  Explained transient nipple tenderness.  Demonstrated hand expression and to rub expressed colostrum to nipples after breast feed to prevent bacteria, lubricate and help with discomfort.  Coconut oil also given with instructions in use.  During the course of the feeding she would fall asleep sometimes slipping off to the tip of the nipple requiring re latching.  Encouraged a deep latch and demonstrated how to massage breast to keep her actively sucking while at the breast.  Hand out given on what to expect the first 4 days of [name]'s life with feeding reviewing normal newborn stomach size, adequate intake and out put, supply and demand, normal course of lactation and routine newborn feeding patterns.  Lactation [employer] given and reviewed.  Lactation name and number written on white board and encouraged to call with any questions, concerns or assistance.    Maternal Data Formula Feeding for Exclusion: No Has patient been taught Hand Expression?: Yes Does the patient have breastfeeding experience prior to this delivery?: No  (Gr1)  Feeding Feeding Type: Breast Fed  LATCH Score Latch: Repeated attempts needed to sustain latch, nipple held in mouth throughout feeding, stimulation needed to elicit sucking reflex.  Audible Swallowing: A few with stimulation  Type of Nipple: Everted at rest and after stimulation  Comfort (Breast/Nipple): Filling, red/small blisters or bruises, mild/mod discomfort  Hold (Positioning): Assistance needed to correctly position infant at breast and maintain latch.  LATCH Score: 6  Interventions Interventions: Breast feeding basics reviewed;Assisted with latch;Skin to skin;Breast massage;Hand express;Reverse pressure;Breast compression;Adjust position;Support pillows;Position options;Coconut oil  Lactation Tools Discussed/Used Tools: Coconut oil;Other (comment) (Boppy pillow) WIC Program: No Higher education careers adviser)   Consult Status Consult Status: Follow-up Follow-up type: Call as needed    Jarold Motto 06/23/2020, 5:12 PM

## 2020-06-23 NOTE — Progress Notes (Signed)
   CTSP with concerns regarding some "chest tightness" and left hip pain postpartum. Patient delivered this Am after a prolonged second stage. Pushed for 3 hours with hips flexed much of the time. Was a little tachycardic immediately postpartum and had blood pressures of 141/88 and 136/81 upon transfer from L&D to [facility] unit. Reports some chest discomfort near sternum with taking a deep breath. Is up ambulating and can move and support weight on left leg. Points to lateral hip area as to location of discomfort when she moves left leg.   Exam: BP 126/87   Pulse 96   Temp 98.5 F (36.9 C) (Oral)   Resp 18   Ht 5\' 3"  (1.6 m)   Wt 83.5 kg   LMP 09/17/2019   SpO2 98%   Breastfeeding Unknown   BMI 32.59 kg/m    General: sitting up, holding baby, in NAD  Heart: RRR without murmur Chest/lungs: CTAB/ normal respiratory effort; tenderness in intercostal areas bilaterally with palpation near sternum near the lower part of the breasts  A: Probable chest wall pain Hip pain on left, possibly from hyperflexing hip with second stage pushing  P: Motrin for chest wall discomfort and hip pain Heating pad for hip May need to consult PT if hip pain persists   Dalia Heading, CNM

## 2020-06-23 NOTE — Progress Notes (Signed)
Subjective:  Doing well, comfortable with epidural in place  Objective:   Vitals: Blood pressure 113/64, pulse 100, temperature (!) 100.7 F (38.2 C), temperature source Axillary, resp. rate 18, height 5\' 3"  (1.6 m), weight 83.5 kg, last menstrual period 09/17/2019, SpO2 99 %. General: NAD Abdomen: soft, gravid Cervical Exam:  Dilation: 8 Effacement (%): 100 Cervical Position: Anterior Station: Plus 1 Presentation: Vertex Exam by:: Macie Burows, MD  FHT: 140, moderate, +accels, no decles Toco: q50min  Results for orders placed or performed during the hospital encounter of 06/22/20 (from the past 24 hour(s))  Protein / creatinine ratio, urine     Status: None   Collection Time: 06/22/20  8:39 AM  Result Value Ref Range   Creatinine, Urine 187 mg/dL   Total Protein, Urine 15 mg/dL   Protein Creatinine Ratio 0.08 0.00 - 0.15 mg/mg[Cre]  CBC     Status: None   Collection Time: 06/22/20  8:40 AM  Result Value Ref Range   WBC 9.4 4.0 - 10.5 K/uL   RBC 4.36 3.87 - 5.11 MIL/uL   Hemoglobin 12.0 12.0 - 15.0 g/dL   HCT 36.4 36 - 46 %   MCV 83.5 80.0 - 100.0 fL   MCH 27.5 26.0 - 34.0 pg   MCHC 33.0 30.0 - 36.0 g/dL   RDW 15.2 11.5 - 15.5 %   Platelets 234 150 - 400 K/uL   nRBC 0.0 0.0 - 0.2 %  Comprehensive metabolic panel     Status: Abnormal   Collection Time: 06/22/20 10:01 AM  Result Value Ref Range   Sodium 135 135 - 145 mmol/L   Potassium 4.4 3.5 - 5.1 mmol/L   Chloride 102 98 - 111 mmol/L   CO2 21 (L) 22 - 32 mmol/L   Glucose, Bld 84 70 - 99 mg/dL   BUN 9 6 - 20 mg/dL   Creatinine, Ser 0.79 0.44 - 1.00 mg/dL   Calcium 8.9 8.9 - 10.3 mg/dL   Total Protein 6.6 6.5 - 8.1 g/dL   Albumin 3.2 (L) 3.5 - 5.0 g/dL   AST 26 15 - 41 U/L   ALT 22 0 - 44 U/L   Alkaline Phosphatase 249 (H) 38 - 126 U/L   Total Bilirubin 0.6 0.3 - 1.2 mg/dL   GFR calc non Af Amer >60 >60 mL/min   GFR calc Af Amer >60 >60 mL/min   Anion gap 12 5 - 15  Type and screen Ordered by ORDER LAB      Status: None   Collection Time: 06/22/20 10:01 AM  Result Value Ref Range   ABO/RH(D) O POS    Antibody Screen NEG    Sample Expiration      06/25/2020,2359 Performed at [facility], [address] [place], [place] [address]   ABO/Rh     Status: None   Collection Time: 06/22/20 12:06 PM  Result Value Ref Range   ABO/RH(D)      O POS Performed at [facility], [address] [place],  [address]     Assessment:   34 y.o. G1P0000 [redacted]w[redacted]d IOL  Plan:   1) Labor - continues to make spontaneous change after AROM without need for pitocin  2) Fetus - cat II, occasional late decelerations noted.   3) Temp of 100.7 - no other evidence of chorioamnionitis.  AROM for 10-hrs patient is on Ancef for GBS ppx  Malachy Mood, MD, [place], [facility] 06/23/2020, 3:52 AM

## 2020-06-23 NOTE — Discharge Summary (Signed)
Postpartum Discharge Summary  Date of Service updated 06/25/2020     Patient Name: Erika Moore DOB: [birth year] MRN: [id number]  Date of admission: 06/22/2020 Delivery date:06/23/2020  Delivering provider: Malachy Mood  Date of discharge: 06/25/2020  Admitting diagnosis: Encounter for elective induction of labor [Z34.90] Intrauterine pregnancy: [redacted]w[redacted]d     Secondary diagnosis:  Active Problems:   Encounter for elective induction of labor   Postpartum care following vaginal delivery   Single liveborn infant delivered vaginally   Encounter for care or examination of lactating mother  Additional problems: none    Discharge diagnosis: Term Pregnancy Delivered                                              Post partum procedures:N/A Augmentation: AROM, Pitocin and Cytotec Complications: None  Hospital course: Induction of Labor With Vaginal Delivery   34 y.o. yo G1P0000 at [redacted]w[redacted]d was admitted to the hospital 06/22/2020 for induction of labor.  Indication for induction: Elective.  Patient had an uncomplicated labor course as follows: Membrane Rupture Time/Date: 5:22 PM ,06/22/2020   Delivery Method:Vaginal, Spontaneous  Episiotomy:   Lacerations:    Details of delivery can be found in separate delivery note.  Patient had a routine postpartum course. Patient is discharged home 06/25/20.  Newborn Data: Birth date:06/23/2020  Birth time:9:48 AM  Gender:Female  Living status:Living  Apgars:8 ,9  Weight:3310 g   Magnesium Sulfate received: No BMZ received: No Rhophylac:N/A MMR:N/A T-DaP:Given prenatally Flu: N/A Transfusion:No  Physical exam  Vitals:   06/24/20 1600 06/24/20 2320 06/25/20 0153 06/25/20 0833  BP: 120/79 (!) 130/91 125/88 123/86  Pulse: 80 82 74 67  Resp: $Remo'16 16 18 19  'dIPoA$ Temp: 98.4 F (36.9 C) 98.1 F (36.7 C) 97.7 F (36.5 C) 97.7 F (36.5 C)  TempSrc: Oral Oral Oral Oral  SpO2: 98% 98% 99% 100%  Weight:      Height:       General: alert,  cooperative and no distress Lochia: appropriate Uterine Fundus: firm Incision: N/A DVT Evaluation: No evidence of DVT seen on physical exam. Labs: Lab Results  Component Value Date   WBC 20.0 (H) 06/24/2020   HGB 10.9 (L) 06/24/2020   HCT 33.7 (L) 06/24/2020   MCV 84.7 06/24/2020   PLT 221 06/24/2020   CMP Latest Ref Rng & Units 06/22/2020  Glucose 70 - 99 mg/dL 84  BUN 6 - 20 mg/dL 9  Creatinine 0.44 - 1.00 mg/dL 0.79  Sodium 135 - 145 mmol/L 135  Potassium 3.5 - 5.1 mmol/L 4.4  Chloride 98 - 111 mmol/L 102  CO2 22 - 32 mmol/L 21(L)  Calcium 8.9 - 10.3 mg/dL 8.9  Total Protein 6.5 - 8.1 g/dL 6.6  Total Bilirubin 0.3 - 1.2 mg/dL 0.6  Alkaline Phos 38 - 126 U/L 249(H)  AST 15 - 41 U/L 26  ALT 0 - 44 U/L 22   Edinburgh Score: Edinburgh Postnatal Depression Scale Screening Tool 06/24/2020  I have been able to laugh and see the funny side of things. 0  I have looked forward with enjoyment to things. 0  I have blamed myself unnecessarily when things went wrong. 0  I have been anxious or worried for no good reason. 0  I have felt scared or panicky for no good reason. 0  Things have been getting on top of  me. 0  I have been so unhappy that I have had difficulty sleeping. 0  I have felt sad or miserable. 0  I have been so unhappy that I have been crying. 0  The thought of harming myself has occurred to me. 0  Edinburgh Postnatal Depression Scale Total 0      After visit meds:  Allergies as of 06/25/2020      Reactions   Other Itching   WALNUTS   Sulfa Antibiotics Hives      Medication List    STOP taking these medications   montelukast 10 MG tablet Commonly known as: SINGULAIR   Nasacort Allergy 24HR 55 MCG/ACT Aero nasal inhaler Generic drug: triamcinolone     TAKE these medications   albuterol 108 (90 Base) MCG/ACT inhaler Commonly known as: VENTOLIN HFA Inhale 1-2 puffs into the lungs every 4 (four) hours as needed for wheezing or shortness of breath.    esomeprazole 20 MG capsule Commonly known as: NEXIUM Take 20 mg by mouth every morning.   norethindrone 0.35 MG tablet Commonly known as: MICRONOR Take 1 tablet (0.35 mg total) by mouth daily. Start taking on: July 16, 2020   prenatal multivitamin Tabs tablet Take 1 tablet by mouth daily at 12 noon.        Discharge home in stable condition Infant Feeding: Breast Infant Disposition:home with mother Discharge instruction: per After Visit Summary and Postpartum booklet. Activity: Advance as tolerated. Pelvic rest for 6 weeks.  Diet: routine diet Anticipated Birth Control: POPs Postpartum Appointment:1 week Additional Postpartum F/U: BP check 1 week Future Appointments:No future appointments. Follow up Visit:  Follow-up Information    Malachy Mood, MD Follow up in 1 week(s).   Specialty: Obstetrics and Gynecology Why: BP check Contact information: [address] [place] [address] [phone]                   06/25/2020 Rod Can, CNM

## 2020-06-24 LAB — CBC
HCT: 33.7 % — ABNORMAL LOW (ref 36.0–46.0)
Hemoglobin: 10.9 g/dL — ABNORMAL LOW (ref 12.0–15.0)
MCH: 27.4 pg (ref 26.0–34.0)
MCHC: 32.3 g/dL (ref 30.0–36.0)
MCV: 84.7 fL (ref 80.0–100.0)
Platelets: 221 10*3/uL (ref 150–400)
RBC: 3.98 MIL/uL (ref 3.87–5.11)
RDW: 15.7 % — ABNORMAL HIGH (ref 11.5–15.5)
WBC: 20 10*3/uL — ABNORMAL HIGH (ref 4.0–10.5)
nRBC: 0 % (ref 0.0–0.2)

## 2020-06-24 MED ORDER — DOCUSATE SODIUM 100 MG PO CAPS
100.0000 mg | ORAL_CAPSULE | Freq: Every day | ORAL | Status: DC
  Administered 2020-06-24 – 2020-06-25 (×2): 100 mg via ORAL
  Filled 2020-06-24 (×2): qty 1

## 2020-06-24 NOTE — Progress Notes (Signed)
Subjective:  Doing well postpartum day 1. She is tolerating regular diet. Her pain is controlled with PO medications. She is ambulating and voiding without difficulty. She reports baby has good latch, however, has been spitting up a lot of fluid since delivery. She is leaning towards staying until tomorrow.  Objective:  Vital signs in last 24 hours: Temp:  [97.5 F (36.4 C)-98.5 F (36.9 C)] 97.9 F (36.6 C) (08/28 0828) Pulse Rate:  [78-114] 81 (08/28 0828) Resp:  [15-20] 15 (08/28 0828) BP: (112-141)/(77-98) 113/80 (08/28 0828) SpO2:  [96 %-100 %] 98 % (08/28 0828) Last BM Date: 06/22/20  General: NAD Pulmonary: no increased work of breathing Abdomen: non-distended, non-tender, fundus firm at level of umbilicus Extremities: no edema, no erythema, no tenderness  Results for orders placed or performed during the hospital encounter of 06/22/20 (from the past 72 hour(s))  Protein / creatinine ratio, urine     Status: None   Collection Time: 06/22/20  8:39 AM  Result Value Ref Range   Creatinine, Urine 187 mg/dL   Total Protein, Urine 15 mg/dL    Comment: NO NORMAL RANGE ESTABLISHED FOR THIS TEST   Protein Creatinine Ratio 0.08 0.00 - 0.15 mg/mg[Cre]    Comment: Performed at [facility], [address] [place], [place] [address]  CBC     Status: None   Collection Time: 06/22/20  8:40 AM  Result Value Ref Range   WBC 9.4 4.0 - 10.5 K/uL   RBC 4.36 3.87 - 5.11 MIL/uL   Hemoglobin 12.0 12.0 - 15.0 g/dL   HCT 36.4 36 - 46 %   MCV 83.5 80.0 - 100.0 fL   MCH 27.5 26.0 - 34.0 pg   MCHC 33.0 30.0 - 36.0 g/dL   RDW 15.2 11.5 - 15.5 %   Platelets 234 150 - 400 K/uL   nRBC 0.0 0.0 - 0.2 %    Comment: Performed at [facility], [address] [place], [place] [address]  RPR     Status: None   Collection Time: 06/22/20  8:40 AM  Result Value Ref Range   RPR Ser Ql NON REACTIVE NON REACTIVE    Comment: Performed at [facility], [place] [address]  [place], [place] [address]  Comprehensive metabolic panel     Status: Abnormal   Collection Time: 06/22/20 10:01 AM  Result Value Ref Range   Sodium 135 135 - 145 mmol/L   Potassium 4.4 3.5 - 5.1 mmol/L   Chloride 102 98 - 111 mmol/L   CO2 21 (L) 22 - 32 mmol/L   Glucose, Bld 84 70 - 99 mg/dL    Comment: Glucose reference range applies only to samples taken after fasting for at least 8 hours.   BUN 9 6 - 20 mg/dL   Creatinine, Ser 0.79 0.44 - 1.00 mg/dL   Calcium 8.9 8.9 - 10.3 mg/dL   Total Protein 6.6 6.5 - 8.1 g/dL   Albumin 3.2 (L) 3.5 - 5.0 g/dL   AST 26 15 - 41 U/L   ALT 22 0 - 44 U/L   Alkaline Phosphatase 249 (H) 38 - 126 U/L   Total Bilirubin 0.6 0.3 - 1.2 mg/dL   GFR calc non Af Amer >60 >60 mL/min   GFR calc Af Amer >60 >60 mL/min   Anion gap 12 5 - 15    Comment: Performed at [facility], [address] [place], [place] [address]  Type and screen Ordered by ORDER  LAB     Status: None   Collection Time: 06/22/20 10:01 AM  Result Value Ref Range   ABO/RH(D) O POS    Antibody Screen NEG    Sample Expiration      06/25/2020,2359 Performed at [facility], [address] [place], [place] [address]   ABO/Rh     Status: None   Collection Time: 06/22/20 12:06 PM  Result Value Ref Range   ABO/RH(D)      O POS Performed at [facility], [address] [place], [place] [address]   CBC     Status: Abnormal   Collection Time: 06/24/20  3:42 AM  Result Value Ref Range   WBC 20.0 (H) 4.0 - 10.5 K/uL   RBC 3.98 3.87 - 5.11 MIL/uL   Hemoglobin 10.9 (L) 12.0 - 15.0 g/dL   HCT 33.7 (L) 36 - 46 %   MCV 84.7 80.0 - 100.0 fL   MCH 27.4 26.0 - 34.0 pg   MCHC 32.3 30.0 - 36.0 g/dL   RDW 15.7 (H) 11.5 - 15.5 %   Platelets 221 150 - 400 K/uL   nRBC 0.0 0.0 - 0.2 %    Comment: Performed at [facility], [address], [place], [place] [address]    Assessment:   34 y.o. G1P1001 postpartum day # 1, lactating  Plan:    1) Acute blood  loss anemia - hemodynamically stable and asymptomatic - po ferrous sulfate  2) Blood Type --/--/O POS Performed at [facility], [address] [place], [place] [address]  [phone]/26 1206) / Rubella 2.89 (01/04 1540) / Varicella Immune  3) TDAP status up to date  4) Feeding plan breast  5)  Education given regarding options for contraception, as well as compatibility with breast feeding if applicable.  Patient plans on oral progesterone-only contraceptive for contraception.  6) Disposition: continue current care and possible d/c later today pending newborn progress   Rod Can, [facility] 06/24/2020, 9:43 AM

## 2020-06-24 NOTE — Anesthesia Postprocedure Evaluation (Signed)
Anesthesia Post Note  Patient: Erika Moore  Procedure(s) Performed: AN AD HOC LABOR EPIDURAL  Patient location during evaluation: Mother Baby Anesthesia Type: Epidural Level of consciousness: awake and alert Pain management: pain level controlled Vital Signs Assessment: post-procedure vital signs reviewed and stable Respiratory status: spontaneous breathing, nonlabored ventilation and respiratory function stable Cardiovascular status: stable Postop Assessment: no headache, no backache and epidural receding Anesthetic complications: no   No complications documented.   Last Vitals:  Vitals:   06/24/20 0346 06/24/20 0828  BP: 123/78 113/80  Pulse: 78 81  Resp: 18 15  Temp: 36.4 C 36.6 C  SpO2: 98% 98%    Last Pain:  Vitals:   06/24/20 0828  TempSrc: Oral  PainSc:                  Arita Miss

## 2020-06-25 ENCOUNTER — Ambulatory Visit: Payer: Self-pay

## 2020-06-25 MED ORDER — NORETHINDRONE 0.35 MG PO TABS: 1.0000 | ORAL_TABLET | Freq: Every day | ORAL | 4 refills | Status: AC

## 2020-06-25 MED ORDER — CALCIUM CARBONATE ANTACID 500 MG PO CHEW
1.0000 | CHEWABLE_TABLET | Freq: Three times a day (TID) | ORAL | Status: DC
  Administered 2020-06-25: 200 mg via ORAL
  Filled 2020-06-25: qty 1

## 2020-06-25 MED ORDER — CALCIUM CARBONATE ANTACID 500 MG PO CHEW
1.0000 | CHEWABLE_TABLET | Freq: Once | ORAL | Status: AC | PRN
  Administered 2020-06-25: 200 mg via ORAL
  Filled 2020-06-25: qty 1

## 2020-06-25 NOTE — Discharge Instructions (Signed)
Postpartum Care After Vaginal Delivery This sheet gives you information about how to care for yourself from the time you deliver your baby to up to 6-12 weeks after delivery (postpartum period). Your health care provider may also give you more specific instructions. If you have problems or questions, contact your health care provider. Follow these instructions at home: Vaginal bleeding  It is normal to have vaginal bleeding (lochia) after delivery. Wear a sanitary pad for vaginal bleeding and discharge. ? During the first week after delivery, the amount and appearance of lochia is often similar to a menstrual period. ? Over the next few weeks, it will gradually decrease to a dry, yellow-brown discharge. ? For most women, lochia stops completely by 4-6 weeks after delivery. Vaginal bleeding can vary from woman to woman.  Change your sanitary pads frequently. Watch for any changes in your flow, such as: ? A sudden increase in volume. ? A change in color. ? Large blood clots.  If you pass a blood clot from your vagina, save it and call your health care provider to discuss. Do not flush blood clots down the toilet before talking with your health care provider.  Do not use tampons or douches until your health care provider says this is safe.  If you are not breastfeeding, your period should return 6-8 weeks after delivery. If you are feeding your child breast milk only (exclusive breastfeeding), your period may not return until you stop breastfeeding. Perineal care  Keep the area between the vagina and the anus (perineum) clean and dry as told by your health care provider. Use medicated pads and pain-relieving sprays and creams as directed.  If you had a cut in the perineum (episiotomy) or a tear in the vagina, check the area for signs of infection until you are healed. Check for: ? More redness, swelling, or pain. ? Fluid or blood coming from the cut or tear. ? Warmth. ? Pus or a bad  smell.  You may be given a squirt bottle to use instead of wiping to clean the perineum area after you go to the bathroom. As you start healing, you may use the squirt bottle before wiping yourself. Make sure to wipe gently.  To relieve pain caused by an episiotomy, a tear in the vagina, or swollen veins in the anus (hemorrhoids), try taking a warm sitz bath 2-3 times a day. A sitz bath is a warm water bath that is taken while you are sitting down. The water should only come up to your hips and should cover your buttocks. Breast care  Within the first few days after delivery, your breasts may feel heavy, full, and uncomfortable (breast engorgement). Milk may also leak from your breasts. Your health care provider can suggest ways to help relieve the discomfort. Breast engorgement should go away within a few days.  If you are breastfeeding: ? Wear a bra that supports your breasts and fits you well. ? Keep your nipples clean and dry. Apply creams and ointments as told by your health care provider. ? You may need to use breast pads to absorb milk that leaks from your breasts. ? You may have uterine contractions every time you breastfeed for up to several weeks after delivery. Uterine contractions help your uterus return to its normal size. ? If you have any problems with breastfeeding, work with your health care provider or Science writer.  If you are not breastfeeding: ? Avoid touching your breasts a lot. Doing this can make  your breasts produce more milk. ? Wear a good-fitting bra and use cold packs to help with swelling. ? Do not squeeze out (express) milk. This causes you to make more milk. Intimacy and sexuality  Ask your health care provider when you can engage in sexual activity. This may depend on: ? Your risk of infection. ? How fast you are healing. ? Your comfort and desire to engage in sexual activity.  You are able to get pregnant after delivery, even if you have not had  your period. If desired, talk with your health care provider about methods of birth control (contraception). Medicines  Take over-the-counter and prescription medicines only as told by your health care provider.  If you were prescribed an antibiotic medicine, take it as told by your health care provider. Do not stop taking the antibiotic even if you start to feel better. Activity  Gradually return to your normal activities as told by your health care provider. Ask your health care provider what activities are safe for you.  Rest as much as possible. Try to rest or take a nap while your baby is sleeping. Eating and drinking   Drink enough fluid to keep your urine pale yellow.  Eat high-fiber foods every day. These may help prevent or relieve constipation. High-fiber foods include: ? Whole grain cereals and breads. ? Brown rice. ? Beans. ? Fresh fruits and vegetables.  Do not try to lose weight quickly by cutting back on calories.  Take your prenatal vitamins until your postpartum checkup or until your health care provider tells you it is okay to stop. Lifestyle  Do not use any products that contain nicotine or tobacco, such as cigarettes and e-cigarettes. If you need help quitting, ask your health care provider.  Do not drink alcohol, especially if you are breastfeeding. General instructions  Keep all follow-up visits for you and your baby as told by your health care provider. Most women visit their health care provider for a postpartum checkup within the first 3-6 weeks after delivery. Contact a health care provider if:  You feel unable to cope with the changes that your child brings to your life, and these feelings do not go away.  You feel unusually sad or worried.  Your breasts become red, painful, or hard.  You have a fever.  You have trouble holding urine or keeping urine from leaking.  You have little or no interest in activities you used to enjoy.  You have not  breastfed at all and you have not had a menstrual period for 12 weeks after delivery.  You have stopped breastfeeding and you have not had a menstrual period for 12 weeks after you stopped breastfeeding.  You have questions about caring for yourself or your baby.  You pass a blood clot from your vagina. Get help right away if:  You have chest pain.  You have difficulty breathing.  You have sudden, severe leg pain.  You have severe pain or cramping in your lower abdomen.  You bleed from your vagina so much that you fill more than one sanitary pad in one hour. Bleeding should not be heavier than your heaviest period.  You develop a severe headache.  You faint.  You have blurred vision or spots in your vision.  You have bad-smelling vaginal discharge.  You have thoughts about hurting yourself or your baby. If you ever feel like you may hurt yourself or others, or have thoughts about taking your own life, get help  right away. You can go to the nearest emergency department or call:  Your local emergency services (911 in the [place]  A suicide crisis helpline, such as the National Suicide Prevention Lifeline at 1-800-273-8255. This is open 24 hours a day. Summary  The period of time right after you deliver your newborn up to 6-12 weeks after delivery is called the postpartum period.  Gradually return to your normal activities as told by your health care provider.  Keep all follow-up visits for you and your baby as told by your health care provider. This information is not intended to replace advice given to you by your health care provider. Make sure you discuss any questions you have with your health care provider. Document Revised: 10/17/2017 Document Reviewed: 07/28/2017 Elsevier Patient Education  2020 [employer]  Postpartum Baby Blues The postpartum period begins right after the birth of a baby. During this time, there is often a lot of joy and excitement. It is also a  time of many changes in the life of the parents. No matter how many times a mother gives birth, each child brings new challenges to the family, including different ways of relating to one another. It is common to have feelings of excitement along with confusing changes in moods, emotions, and thoughts. You may feel happy one minute and sad or stressed the next. These feelings of sadness usually happen in the period right after you have your baby, and they go away within a week or two. This is called the "baby blues." What are the causes? There is no known cause of baby blues. It is likely caused by a combination of factors. However, changes in hormone levels after childbirth are believed to trigger some of the symptoms. Other factors that can play a role in these mood changes include:  Lack of sleep.  Stressful life events, such as poverty, caring for a loved one, or death of a loved one.  Genetics. What are the signs or symptoms? Symptoms of this condition include:  Brief changes in mood, such as going from extreme happiness to sadness.  Decreased concentration.  Difficulty sleeping.  Crying spells and tearfulness.  Loss of appetite.  Irritability.  Anxiety. If the symptoms of baby blues last for more than 2 weeks or become more severe, you may have postpartum depression. How is this diagnosed? This condition is diagnosed based on an evaluation of your symptoms. There are no medical or lab tests that lead to a diagnosis, but there are various questionnaires that a health care provider may use to identify women with the baby blues or postpartum depression. How is this treated? Treatment is not needed for this condition. The baby blues usually go away on their own in 1-2 weeks. Social support is often all that is needed. You will be encouraged to get adequate sleep and rest. Follow these instructions at home: Lifestyle      Get as much rest as you can. Take a nap when the baby  sleeps.  Exercise regularly as told by your health care provider. Some women find yoga and walking to be helpful.  Eat a balanced and nourishing diet. This includes plenty of fruits and vegetables, whole grains, and lean proteins.  Do little things that you enjoy. Have a cup of tea, take a bubble bath, read your favorite magazine, or listen to your favorite music.  Avoid alcohol.  Ask for help with household chores, cooking, grocery shopping, or running errands. Do not try to   everything yourself. Consider hiring a postpartum doula to help. This is a professional who specializes in providing support to new mothers.  Try not to make any major life changes during pregnancy or right after giving birth. This can add stress. General instructions  Talk to people close to you about how you are feeling. Get support from your partner, family members, friends, or other new moms. You may want to join a support group.  Find ways to cope with stress. This may include: ? Writing your thoughts and feelings in a journal. ? Spending time outside. ? Spending time with people who make you laugh.  Try to stay positive in how you think. Think about the things you are grateful for.  Take over-the-counter and prescription medicines only as told by your health care provider.  Let your health care provider know if you have any concerns.  Keep all postpartum visits as told by your health care provider. This is important. Contact a health care provider if:  Your baby blues do not go away after 2 weeks. Get help right away if:  You have thoughts of taking your own life (suicidal thoughts).  You think you may harm the baby or other people.  You see or hear things that are not there (hallucinations). Summary  After giving birth, you may feel happy one minute and sad or stressed the next. Feelings of sadness that happen right after the baby is born and go away after a week or two are called the "baby  blues."  You can manage the baby blues by getting enough rest, eating a healthy diet, exercising, spending time with supportive people, and finding ways to cope with stress.  If feelings of sadness and stress last longer than 2 weeks or get in the way of caring for your baby, talk to your health care provider. This may mean you have postpartum depression. This information is not intended to replace advice given to you by your health care provider. Make sure you discuss any questions you have with your health care provider. Document Revised: 02/05/2019 Document Reviewed: 12/10/2016 Elsevier Patient Education  [place]. Breastfeeding and Cracked or Sore Nipples It is normal to have some tenderness in your nipples when you start to breastfeed your new baby. Your nipples can also become cracked or sore. This may happen if your baby or the baby's mouth is not in the right position when breastfeeding. There are things you can do to help avoid these problems. Follow these instructions at home: Breastfeeding strategy   Make sure your baby's mouth attaches to your nipple (latches) properly to breastfeed.  Make sure your baby is in the right position when breastfeeding. Try different positions to find one that works.  Have your baby feed from the less sore breast first.  Break the latch between your baby's mouth and your nipple before removing him or her from your breast. To do this: ? Put your little finger in between your nipple and your baby's gums. If you use a breast pump:  Make sure the part of the pump that goes over your nipple fits properly.  Start by setting the pump to a low setting. Increase the pump strength over time as needed. Too high of a setting may cause damage to your nipple. Breast care To help your breasts and nipples stay healthy:  Avoid the use of soap on your nipples.  Wear a supportive bra. Avoid wearing underwire bras or tight bras.  Air-dry your  nipples  for 3-4 minutes after each feeding.  Use only cotton bra pads to soak up any breast milk that leaks. Be sure to change the pads if they become soaked with milk.  Put some lanolin on your nipples after breastfeeding. Pure lanolin does not need to be washed off your nipple before you feed your baby again. Pure lanolin is not harmful to your baby.  Rub some breast milk into your nipples: ? Use your hand to squeeze out a few drops of breast milk. ? Gently massage the milk into your nipples. ? Let your nipples air-dry. Contact a doctor if:  You have nipple pain.  You have soreness or cracking that lasts more than 1 week. Summary  It is normal to have some tenderness in your nipples when you start to breastfeed your new baby. But you should contact your doctor if you have nipple pain.  Your nipples can become cracked or sore if your baby's mouth does not attach to your nipple properly when breastfeeding.  Rub lanolin or breast milk onto your nipples to keep them from getting cracked or sore. Avoid washing your nipples with soap. This information is not intended to replace advice given to you by your health care provider. Make sure you discuss any questions you have with your health care provider. Document Revised: 02/03/2019 Document Reviewed: 05/22/2017 Elsevier Patient Education  [place].

## 2020-06-25 NOTE — Lactation Note (Signed)
This note was copied from a baby's chart. Lactation Consultation Note  Patient Name: Erika Moore [id number]'[id number] Date: 06/25/2020   Observed one last breast feed before discharge.  Mom has blood blister on left nipple where [name] had been pinching tip of nipple.  Demonstrated how to gently lower chin and make sure lip was flanged out ward for deeper latch.  Mom voices it feeling some better.  Mom requested nipple shield in case nipple pain got any worse.  Given and instructed in use of applying nipple shield but discussed risks to milk supply and nipple confusion. Mom agreed not to use unless absolutely last option and only if pain got worse.  Comfort gels given which seemed to help.  Reminded of contact numbers if needed to call with questions, concerns or assistance after discharge.  Maternal Data    Feeding    LATCH Score                   Interventions    Lactation Tools Discussed/Used     Consult Status      Jarold Motto 06/25/2020, 10:51 PM

## 2020-06-25 NOTE — Progress Notes (Signed)
Pt discharged with infant.  Discharge instructions, prescriptions and follow up appointment given to and reviewed with pt. Pt verbalized understanding. Escorted out by auxillary. 

## 2020-06-25 NOTE — Progress Notes (Signed)
C/O indigestion and H/A. TUMS given for c/o indigestion and Tylenol for c/o H/A. Pt. Reports Hx. Of Esophageal Reflux. Rechecked B/P die to 2320 reading of 130/91 and B/P is 125/88 at 0153. Pt. Has not slept any more then "three hours" since admission and Infant is cluster feeding this shift. Will cont. To support Mom and offer any assistance she needs.

## 2020-06-26 ENCOUNTER — Ambulatory Visit (INDEPENDENT_AMBULATORY_CARE_PROVIDER_SITE_OTHER): Payer: BC Managed Care – PPO | Admitting: Obstetrics and Gynecology

## 2020-06-26 ENCOUNTER — Emergency Department
Admission: EM | Admit: 2020-06-26 | Discharge: 2020-06-26 | Discharge status: Absent without leave | Payer: BC Managed Care – PPO

## 2020-06-26 ENCOUNTER — Other Ambulatory Visit: Payer: Self-pay

## 2020-06-26 ENCOUNTER — Encounter: Payer: Self-pay | Admitting: Obstetrics and Gynecology

## 2020-06-26 VITALS — BP 134/92 | HR 73 | Ht 63.0 in | Wt 187.0 lb

## 2020-06-26 DIAGNOSIS — O1494 Unspecified pre-eclampsia, complicating childbirth: Secondary | ICD-10-CM

## 2020-06-26 MED ORDER — NYSTATIN 100000 UNIT/GM EX CREA: 1.0000 "application " | TOPICAL_CREAM | Freq: Two times a day (BID) | CUTANEOUS | 1 refills | Status: AC

## 2020-06-26 MED ORDER — LABETALOL HCL 100 MG PO TABS: 100.0000 mg | ORAL_TABLET | Freq: Two times a day (BID) | ORAL | 0 refills | Status: DC

## 2020-06-26 NOTE — Progress Notes (Signed)
Obstetrics & Gynecology Office Visit   Chief Complaint:  Chief Complaint  Patient presents with  . Postpartum Care    Vaginal delivery 8/27  . Headache    B/P check    History of Present Illness: 34 y.o. G1P1001 being seen for follow up blood pressure check today.  The patient is postpartumThe established diagnosis for the patient is gestational hypertension.  She is currently on no antihypertensives.  She reports no current symptoms attributable to her blood pressure.  Medication list reviewed medications which may contribute to BP elevation were not noted.  Did note some headaches during delivery but normotensive.  Had on elevated BP in late 2nd trimester. ER visit earlier today normotensive.  Review of Systems: Review of Systems  Constitutional: Negative.   Gastrointestinal: Negative.   Genitourinary: Negative.   Neurological: Negative for headaches.  Psychiatric/Behavioral: Negative.      Past Medical History:  Past Medical History:  Diagnosis Date  . Allergic   . Asthma   . BRCA negative 08/2019   MyRisk neg  . Family history of breast cancer 07/2019  . Family history of ovarian cancer 08/2019   MyRisk neg  . GERD (gastroesophageal reflux disease)   . HSV (herpes simplex virus) infection   . Immunization, viral disease    Gardasil vaccine completed  . Increased risk of breast cancer 08/2019   IBIS=20.2%/riskscore=16.5%  . TMJ (dislocation of temporomandibular joint)     Past Surgical History:  Past Surgical History:  Procedure Laterality Date  . ESOPHAGOGASTRODUODENOSCOPY (EGD) WITH PROPOFOL N/A 09/03/2017   Procedure: ESOPHAGOGASTRODUODENOSCOPY (EGD) WITH PROPOFOL;  Surgeon: Lin Landsman, MD;  Location: [facility];  Service: Endoscopy;  Laterality: N/A;  . HERNIA REPAIR    . HYSTEROSCOPY WITH D & C N/A 06/24/2019   Procedure: DILATATION AND CURETTAGE /HYSTEROSCOPY;  Surgeon: Malachy Mood, MD;  Location: [facility];  Service: Gynecology;   Laterality: N/A;  . MOLE REMOVAL    . TONSILLECTOMY      Gynecologic History: No LMP recorded.  Obstetric History: G1P1001  Family History:  Family History  Problem Relation Age of Onset  . GER disease Mother   . Irritable bowel syndrome Mother   . Hypertension Mother   . Pancreatic cancer Maternal Grandmother 90  . Ovarian cancer Paternal Grandmother 53  . Leukemia Paternal Grandfather   . Breast cancer Paternal Aunt 62       Genetic test negative    Social History:  Social History   Socioeconomic History  . Marital status: Married    Spouse name: Jenny Reichmann  . Number of children: Not on file  . Years of education: Not on file  . Highest education level: Not on file  Occupational History  . Not on file  Tobacco Use  . Smoking status: Never Smoker  . Smokeless tobacco: Never Used  Vaping Use  . Vaping Use: Never used  Substance and Sexual Activity  . Alcohol use: Yes    Comment: occasional  . Drug use: No  . Sexual activity: Yes    Birth control/protection: Pill  Other Topics Concern  . Not on file  Social History Narrative  . Not on file   Social Determinants of Health   Financial Resource Strain:   . Difficulty of Paying Living Expenses: Not on file  Food Insecurity:   . Worried About Charity fundraiser in the Last Year: Not on file  . Ran Out of Food in the Last Year:  Not on file  Transportation Needs:   . Lack of Transportation (Medical): Not on file  . Lack of Transportation (Non-Medical): Not on file  Physical Activity:   . Days of Exercise per Week: Not on file  . Minutes of Exercise per Session: Not on file  Stress:   . Feeling of Stress : Not on file  Social Connections:   . Frequency of Communication with Friends and Family: Not on file  . Frequency of Social Gatherings with Friends and Family: Not on file  . Attends Religious Services: Not on file  . Active Member of Clubs or Organizations: Not on file  . Attends Archivist  Meetings: Not on file  . Marital Status: Not on file  Intimate Partner Violence:   . Fear of Current or Ex-Partner: Not on file  . Emotionally Abused: Not on file  . Physically Abused: Not on file  . Sexually Abused: Not on file    Allergies:  Allergies  Allergen Reactions  . Other Itching    WALNUTS  . Sulfa Antibiotics Hives    Medications: Prior to Admission medications   Medication Sig Start Date End Date Taking? Authorizing Provider  albuterol (VENTOLIN HFA) 108 (90 Base) MCG/ACT inhaler Inhale 1-2 puffs into the lungs every 4 (four) hours as needed for wheezing or shortness of breath.    [provider]  esomeprazole (NEXIUM) 20 MG capsule Take 20 mg by mouth every morning.     [provider]  norethindrone (MICRONOR) 0.35 MG tablet Take 1 tablet (0.35 mg total) by mouth daily. 07/16/20   Rod Can, CNM  Prenatal Vit-Fe Fumarate-FA (PRENATAL MULTIVITAMIN) TABS tablet Take 1 tablet by mouth daily at 12 noon.    [provider]    Physical Exam Blood pressure (!) 134/92, pulse 73, height $RemoveBe'5\' 3"'ehIIOSuHR$  (1.6 m), weight 187 lb (84.8 kg), currently breastfeeding.  No LMP recorded.  General: NAD HEENT: normocephalic, anicteric Pulmonary: No increased work of breathing Cardiovascular: RRR, distal pulses 2+ Extremities: 1+edema, no erythema, no tenderness Neurologic: Grossly intact Psychiatric: mood appropriate, affect full  Assessment: 34 y.o. G1P1001 presenting for blood pressure evaluation today  Plan: Problem List Items Addressed This Visit    None    Visit Diagnoses    Gestational hypertension with significant proteinuria, delivered    -  Primary   Relevant Medications   labetalol (NORMODYNE) 100 MG tablet      1) Blood pressure - blood pressure at today's visit is mildly elevated.  As a result antihypertensive therapy is currently not warranted.  However we did discuss new push for starting anithypertensives for mild elevations and given  diastolic of 92 will start labetalol $RemoveBeforeD'100mg'[id number]$  po bid - additional blood work was not obtained  - follow up BP check 1 week   Malachy Mood, MD, [place], [place] 06/26/2020, 11:41 AM

## 2020-06-26 NOTE — Addendum Note (Signed)
Addended by: Dorthula Nettles on: 06/26/2020 01:36 PM   Modules accepted: Level of Service

## 2020-06-27 ENCOUNTER — Ambulatory Visit: Payer: BC Managed Care – PPO | Admitting: Obstetrics

## 2020-06-30 ENCOUNTER — Other Ambulatory Visit: Payer: Self-pay | Admitting: Obstetrics and Gynecology

## 2020-06-30 MED ORDER — FLUCONAZOLE 150 MG PO TABS: 150.0000 mg | ORAL_TABLET | Freq: Once | ORAL | 0 refills | Status: AC

## 2020-07-04 ENCOUNTER — Encounter: Payer: Self-pay | Admitting: Obstetrics and Gynecology

## 2020-07-04 ENCOUNTER — Ambulatory Visit (INDEPENDENT_AMBULATORY_CARE_PROVIDER_SITE_OTHER): Payer: BC Managed Care – PPO | Admitting: Obstetrics and Gynecology

## 2020-07-04 ENCOUNTER — Other Ambulatory Visit: Payer: Self-pay

## 2020-07-04 VITALS — BP 142/96 | HR 100 | Wt 164.0 lb

## 2020-07-04 DIAGNOSIS — Z0130 Encounter for examination of blood pressure without abnormal findings: Secondary | ICD-10-CM

## 2020-07-04 NOTE — Progress Notes (Signed)
Obstetrics & Gynecology Office Visit   Chief Complaint:  Chief Complaint  Patient presents with  . Postpartum Care    B/P check, trouble sleeping, no energy    History of Present Illness: 34 y.o. G1P1001 being seen for follow up blood pressure check today.  The patient is postpartumThe established diagnosis for the patient is gestational hypertension.  She is currently on no antihypertensives.  She reports no current symptoms attributable to her blood pressure.  Medication list reviewed medications which may contribute to BP elevation were not noted.  Review of Systems: Review of Systems  Constitutional: Negative.   Gastrointestinal: Negative.   Neurological: Negative for headaches.     Past Medical History:  Past Medical History:  Diagnosis Date  . Allergic   . Asthma   . BRCA negative 08/2019   MyRisk neg  . Family history of breast cancer 07/2019  . Family history of ovarian cancer 08/2019   MyRisk neg  . GERD (gastroesophageal reflux disease)   . HSV (herpes simplex virus) infection   . Immunization, viral disease    Gardasil vaccine completed  . Increased risk of breast cancer 08/2019   IBIS=20.2%/riskscore=16.5%  . TMJ (dislocation of temporomandibular joint)     Past Surgical History:  Past Surgical History:  Procedure Laterality Date  . ESOPHAGOGASTRODUODENOSCOPY (EGD) WITH PROPOFOL N/A 09/03/2017   Procedure: ESOPHAGOGASTRODUODENOSCOPY (EGD) WITH PROPOFOL;  Surgeon: Lin Landsman, MD;  Location: [place];  Service: Endoscopy;  Laterality: N/A;  . HERNIA REPAIR    . HYSTEROSCOPY WITH D & C N/A 06/24/2019   Procedure: DILATATION AND CURETTAGE /HYSTEROSCOPY;  Surgeon: Malachy Mood, MD;  Location: [facility];  Service: Gynecology;  Laterality: N/A;  . MOLE REMOVAL    . TONSILLECTOMY      Gynecologic History: No LMP recorded.  Obstetric History: G1P1001  Family History:  Family History  Problem Relation Age of Onset  . GER disease  Mother   . Irritable bowel syndrome Mother   . Hypertension Mother   . Pancreatic cancer Maternal Grandmother 105  . Ovarian cancer Paternal Grandmother 50  . Leukemia Paternal Grandfather   . Breast cancer Paternal Aunt 64       Genetic test negative    Social History:  Social History   Socioeconomic History  . Marital status: Married    Spouse name: Jenny Reichmann  . Number of children: Not on file  . Years of education: Not on file  . Highest education level: Not on file  Occupational History  . Not on file  Tobacco Use  . Smoking status: Never Smoker  . Smokeless tobacco: Never Used  Vaping Use  . Vaping Use: Never used  Substance and Sexual Activity  . Alcohol use: Yes    Comment: occasional  . Drug use: No  . Sexual activity: Yes    Birth control/protection: Pill  Other Topics Concern  . Not on file  Social History Narrative  . Not on file   Social Determinants of Health   Financial Resource Strain:   . Difficulty of Paying Living Expenses: Not on file  Food Insecurity:   . Worried About Charity fundraiser in the Last Year: Not on file  . Ran Out of Food in the Last Year: Not on file  Transportation Needs:   . Lack of Transportation (Medical): Not on file  . Lack of Transportation (Non-Medical): Not on file  Physical Activity:   . Days of Exercise per Week:  Not on file  . Minutes of Exercise per Session: Not on file  Stress:   . Feeling of Stress : Not on file  Social Connections:   . Frequency of Communication with Friends and Family: Not on file  . Frequency of Social Gatherings with Friends and Family: Not on file  . Attends Religious Services: Not on file  . Active Member of Clubs or Organizations: Not on file  . Attends Archivist Meetings: Not on file  . Marital Status: Not on file  Intimate Partner Violence:   . Fear of Current or Ex-Partner: Not on file  . Emotionally Abused: Not on file  . Physically Abused: Not on file  . Sexually Abused:  Not on file    Allergies:  Allergies  Allergen Reactions  . Other Itching    WALNUTS  . Sulfa Antibiotics Hives    Medications: Prior to Admission medications   Medication Sig Start Date End Date Taking? Authorizing Provider  albuterol (VENTOLIN HFA) 108 (90 Base) MCG/ACT inhaler Inhale 1-2 puffs into the lungs every 4 (four) hours as needed for wheezing or shortness of breath.    [provider]  esomeprazole (NEXIUM) 20 MG capsule Take 20 mg by mouth every morning.     [provider]  labetalol (NORMODYNE) 100 MG tablet Take 1 tablet (100 mg total) by mouth 2 (two) times daily. 06/26/20   Malachy Mood, MD  norethindrone (MICRONOR) 0.35 MG tablet Take 1 tablet (0.35 mg total) by mouth daily. 07/16/20   Rod Can, CNM  nystatin cream (MYCOSTATIN) Apply 1 application topically 2 (two) times daily. 06/26/20   Malachy Mood, MD  Prenatal Vit-Fe Fumarate-FA (PRENATAL MULTIVITAMIN) TABS tablet Take 1 tablet by mouth daily at 12 noon.    [provider]    Physical Exam Blood pressure (!) 142/96, pulse 100, weight 164 lb (74.4 kg), currently breastfeeding.  No LMP recorded.  General: NAD HEENT: normocephalic, anicteric Pulmonary: No increased work of breathing Cardiovascular: RRR, distal pulses 2+ Extremities: noedema, no erythema, no tenderness Neurologic: Grossly intact Psychiatric: mood appropriate, affect full  Assessment: 34 y.o. G1P1001 presenting for blood pressure evaluation today  Plan: Problem List Items Addressed This Visit    None      1) Blood pressure - blood pressure at today's visit is slightly elevated.  Patient has not started labetalol.  Lost almost 20lbs in past week - additional blood work was not obtained   Malachy Mood, MD, [place], [place] 07/04/2020, 10:42 AM

## 2020-07-18 ENCOUNTER — Other Ambulatory Visit: Payer: Self-pay | Admitting: Obstetrics and Gynecology

## 2020-07-18 NOTE — Telephone Encounter (Signed)
advise

## 2020-07-20 ENCOUNTER — Other Ambulatory Visit: Payer: Self-pay | Admitting: Obstetrics and Gynecology

## 2020-07-20 DIAGNOSIS — R2242 Localized swelling, mass and lump, left lower limb: Secondary | ICD-10-CM

## 2020-07-21 ENCOUNTER — Ambulatory Visit
Admission: RE | Admit: 2020-07-21 | Discharge: 2020-07-21 | Disposition: A | Discharge status: Home / Self Care | Payer: BC Managed Care – PPO | Source: Ambulatory Visit | Attending: Obstetrics and Gynecology | Admitting: Obstetrics and Gynecology

## 2020-07-21 ENCOUNTER — Other Ambulatory Visit: Payer: Self-pay | Admitting: Obstetrics and Gynecology

## 2020-07-21 ENCOUNTER — Telehealth: Payer: Self-pay | Admitting: Obstetrics and Gynecology

## 2020-07-21 ENCOUNTER — Other Ambulatory Visit: Payer: Self-pay

## 2020-07-21 DIAGNOSIS — R2242 Localized swelling, mass and lump, left lower limb: Secondary | ICD-10-CM

## 2020-07-21 NOTE — Telephone Encounter (Signed)
Staebler ordered a DVT u/s for pt. Scheduled today at [facility], 4:00. Adv pt to arr at 3:45 and that she would not be allowed to bring newborn to study. I gave AMS cell for contact after exam.

## 2020-08-10 ENCOUNTER — Ambulatory Visit: Payer: BC Managed Care – PPO | Admitting: Obstetrics and Gynecology

## 2020-08-23 ENCOUNTER — Encounter: Payer: Self-pay | Admitting: Obstetrics and Gynecology

## 2020-08-23 ENCOUNTER — Ambulatory Visit (INDEPENDENT_AMBULATORY_CARE_PROVIDER_SITE_OTHER): Payer: BC Managed Care – PPO | Admitting: Obstetrics and Gynecology

## 2020-08-23 ENCOUNTER — Other Ambulatory Visit: Payer: Self-pay

## 2020-08-23 ENCOUNTER — Other Ambulatory Visit (HOSPITAL_COMMUNITY)
Admission: RE | Admit: 2020-08-23 | Discharge: 2020-08-23 | Disposition: A | Discharge status: Home / Self Care | Payer: BC Managed Care – PPO | Source: Ambulatory Visit | Attending: Obstetrics and Gynecology | Admitting: Obstetrics and Gynecology

## 2020-08-23 DIAGNOSIS — Z124 Encounter for screening for malignant neoplasm of cervix: Secondary | ICD-10-CM

## 2020-08-23 DIAGNOSIS — N393 Stress incontinence (female) (male): Secondary | ICD-10-CM

## 2020-08-23 DIAGNOSIS — M6289 Other specified disorders of muscle: Secondary | ICD-10-CM

## 2020-08-23 NOTE — Progress Notes (Signed)
Postpartum Visit  Chief Complaint:  Chief Complaint  Patient presents with  . Postpartum Care    History of Present Illness: Patient is a 34 y.o. G1P1001 presents for postpartum visit.  Date of delivery: 06/23/2020 Type of delivery: Vaginal delivery - Vacuum or forceps assisted  no Episiotomy No.  Laceration: yes 1st degree Pregnancy or labor problems:  no Any problems since the delivery:  Yes GHTN, some urinary incontinence  Newborn Details:  SINGLETON :  1. BabyGender female. Maternal Details:  Breast or formula feeding: plans to breastfeed Intercourse: No  Contraception after delivery: No  Any bowel or bladder issues: No  Post partum depression/anxiety noted:  no Edinburgh Post-Partum Depression Score:2 Date of last PAP: 2018  no abnormalities   Review of Systems: Review of Systems  Constitutional: Negative.   Gastrointestinal: Negative.   Genitourinary: Negative.     The following portions of the patient's history were reviewed and updated as appropriate: allergies, current medications, past family history, past medical history, past social history, past surgical history and problem list.  Past Medical History:  Past Medical History:  Diagnosis Date  . Allergic   . Asthma   . BRCA negative 08/2019   MyRisk neg  . Family history of breast cancer 07/2019  . Family history of ovarian cancer 08/2019   MyRisk neg  . GERD (gastroesophageal reflux disease)   . HSV (herpes simplex virus) infection   . Immunization, viral disease    Gardasil vaccine completed  . Increased risk of breast cancer 08/2019   IBIS=20.2%/riskscore=16.5%  . TMJ (dislocation of temporomandibular joint)     Past Surgical History:  Past Surgical History:  Procedure Laterality Date  . ESOPHAGOGASTRODUODENOSCOPY (EGD) WITH PROPOFOL N/A 09/03/2017   Procedure: ESOPHAGOGASTRODUODENOSCOPY (EGD) WITH PROPOFOL;  Surgeon: Lin Landsman, MD;  Location: [place];  Service:  Endoscopy;  Laterality: N/A;  . HERNIA REPAIR    . HYSTEROSCOPY WITH D & C N/A 06/24/2019   Procedure: DILATATION AND CURETTAGE /HYSTEROSCOPY;  Surgeon: Malachy Mood, MD;  Location: [facility];  Service: Gynecology;  Laterality: N/A;  . MOLE REMOVAL    . TONSILLECTOMY      Family History:  Family History  Problem Relation Age of Onset  . GER disease Mother   . Irritable bowel syndrome Mother   . Hypertension Mother   . Pancreatic cancer Maternal Grandmother 74  . Ovarian cancer Paternal Grandmother 33  . Leukemia Paternal Grandfather   . Breast cancer Paternal Aunt 82       Genetic test negative    Social History:  Social History   Socioeconomic History  . Marital status: Married    Spouse name: Jenny Reichmann  . Number of children: Not on file  . Years of education: Not on file  . Highest education level: Not on file  Occupational History  . Not on file  Tobacco Use  . Smoking status: Never Smoker  . Smokeless tobacco: Never Used  Vaping Use  . Vaping Use: Never used  Substance and Sexual Activity  . Alcohol use: Yes    Comment: occasional  . Drug use: No  . Sexual activity: Yes    Birth control/protection: Pill, None  Other Topics Concern  . Not on file  Social History Narrative  . Not on file   Social Determinants of Health   Financial Resource Strain:   . Difficulty of Paying Living Expenses: Not on file  Food Insecurity:   . Worried About Running  Out of Food in the Last Year: Not on file  . Ran Out of Food in the Last Year: Not on file  Transportation Needs:   . Lack of Transportation (Medical): Not on file  . Lack of Transportation (Non-Medical): Not on file  Physical Activity:   . Days of Exercise per Week: Not on file  . Minutes of Exercise per Session: Not on file  Stress:   . Feeling of Stress : Not on file  Social Connections:   . Frequency of Communication with Friends and Family: Not on file  . Frequency of Social Gatherings with Friends and  Family: Not on file  . Attends Religious Services: Not on file  . Active Member of Clubs or Organizations: Not on file  . Attends Banker Meetings: Not on file  . Marital Status: Not on file  Intimate Partner Violence:   . Fear of Current or Ex-Partner: Not on file  . Emotionally Abused: Not on file  . Physically Abused: Not on file  . Sexually Abused: Not on file    Allergies:  Allergies  Allergen Reactions  . Other Itching    WALNUTS  . Sulfa Antibiotics Hives    Medications: Prior to Admission medications   Medication Sig Start Date End Date Taking? Authorizing Provider  esomeprazole (NEXIUM) 20 MG capsule Take 20 mg by mouth every morning.    Yes [provider]  labetalol (NORMODYNE) 100 MG tablet TAKE 1 TABLET BY MOUTH TWICE A DAY 07/18/20  Yes Vena Austria, MD  albuterol (VENTOLIN HFA) 108 (90 Base) MCG/ACT inhaler Inhale 1-2 puffs into the lungs every 4 (four) hours as needed for wheezing or shortness of breath. Patient not taking: Reported on 08/23/2020    [provider]  norethindrone (MICRONOR) 0.35 MG tablet Take 1 tablet (0.35 mg total) by mouth daily. Patient not taking: Reported on 08/23/2020 07/16/20   Tresea Mall, CNM  nystatin cream (MYCOSTATIN) Apply 1 application topically 2 (two) times daily. Patient not taking: Reported on 08/23/2020 06/26/20   Vena Austria, MD  Prenatal Vit-Fe Fumarate-FA (PRENATAL MULTIVITAMIN) TABS tablet Take 1 tablet by mouth daily at 12 noon. Patient not taking: Reported on 08/23/2020    [provider]    Physical Exam Blood pressure 120/80, height 5\' 3"  (1.6 m), weight 157 lb (71.2 kg), currently breastfeeding.    General: NAD HEENT: normocephalic, anicteric Pulmonary: No increased work of breathing Abdomen: NABS, soft, non-tender, non-distended.  Umbilicus without lesions.  No hepatomegaly, splenomegaly or masses palpable. No evidence of hernia. Genitourinary:  External:  Normal external female genitalia.  Normal urethral meatus, normal  Bartholin's and Skene's glands.    Vagina: Normal vaginal mucosa, no evidence of prolapse.    Cervix: Grossly normal in appearance, no bleeding  Uterus: Non-enlarged, mobile, normal contour.  No CMT  Adnexa: ovaries non-enlarged, no adnexal masses  Rectal: deferred Extremities: no edema, erythema, or tenderness Neurologic: Grossly intact Psychiatric: mood appropriate, affect full   Edinburgh Postnatal Depression Scale - 08/23/20 1200      Edinburgh Postnatal Depression Scale:  In the Past 7 Days   I have been able to laugh and see the funny side of things. 0    I have looked forward with enjoyment to things. 0    I have blamed myself unnecessarily when things went wrong. 0    I have been anxious or worried for no good reason. 1    I have felt scared or panicky for no good  reason. 1    Things have been getting on top of me. 0    I have been so unhappy that I have had difficulty sleeping. 0    I have felt sad or miserable. 0    I have been so unhappy that I have been crying. 0    The thought of harming myself has occurred to me. 0    Edinburgh Postnatal Depression Scale Total 2            Edinburgh Postnatal Depression Scale - 08/23/20 1200      Edinburgh Postnatal Depression Scale:  In the Past 7 Days   I have been able to laugh and see the funny side of things. 0    I have looked forward with enjoyment to things. 0    I have blamed myself unnecessarily when things went wrong. 0    I have been anxious or worried for no good reason. 1    I have felt scared or panicky for no good reason. 1    Things have been getting on top of me. 0    I have been so unhappy that I have had difficulty sleeping. 0    I have felt sad or miserable. 0    I have been so unhappy that I have been crying. 0    The thought of harming myself has occurred to me. 0    Edinburgh Postnatal Depression Scale Total 2           Assessment:  34 y.o. G1P1001 presenting for 6 week postpartum visit  Plan: Problem List Items Addressed This Visit    None    Visit Diagnoses    6 weeks postpartum follow-up    -  Primary   Screening for malignant neoplasm of cervix       Relevant Orders   Cytology - PAP   Pelvic floor dysfunction in female       Relevant Orders   Ambulatory referral to Physical Therapy   SUI (stress urinary incontinence, female)       Relevant Orders   Ambulatory referral to Physical Therapy       1) Contraception - Education given regarding options for contraception, as well as compatibility with breast feeding if applicable.  Patient plans on oral progesterone-only contraceptive for contraception.  2)  Pap - ASCCP guidelines and rational discussed.  ASCCP guidelines and rational discussed.  Patient opts for every 3 years screening interval  3) Patient underwent screening for postpartum depression with no signs of depression  4) SUI - Pelvic floor pt  5) GHTN - may stop all antihypertensives at this point  6) Return in about 1 year (around 08/23/2021) for annual.   Malachy Mood, MD, [place], [place] 08/24/2020, 10:03 AM

## 2020-08-30 LAB — CYTOLOGY - PAP
Adequacy: ABSENT
Comment: NEGATIVE
Diagnosis: NEGATIVE
High risk HPV: NEGATIVE

## 2021-01-14 ENCOUNTER — Other Ambulatory Visit: Payer: Self-pay | Admitting: Obstetrics and Gynecology

## 2021-08-31 ENCOUNTER — Ambulatory Visit (INDEPENDENT_AMBULATORY_CARE_PROVIDER_SITE_OTHER): Payer: No Typology Code available for payment source | Admitting: Obstetrics and Gynecology

## 2021-08-31 ENCOUNTER — Other Ambulatory Visit: Payer: Self-pay

## 2021-08-31 ENCOUNTER — Encounter: Payer: Self-pay | Admitting: Obstetrics and Gynecology

## 2021-08-31 VITALS — BP 112/76 | HR 84 | Ht 63.0 in | Wt 166.0 lb

## 2021-08-31 DIAGNOSIS — Z1239 Encounter for other screening for malignant neoplasm of breast: Secondary | ICD-10-CM

## 2021-08-31 DIAGNOSIS — Z01419 Encounter for gynecological examination (general) (routine) without abnormal findings: Secondary | ICD-10-CM

## 2021-08-31 NOTE — Progress Notes (Signed)
Gynecology Annual Exam   PCP: [facility]  Chief Complaint:  Chief Complaint  Patient presents with   Gynecologic Exam    Annual - discuss menses. RM 5    History of Present Illness: Patient is a 35 y.o. G1P1001 presents for annual exam. The patient has no complaints today.   LMP: Patient's last menstrual period was 08/18/2021. Average Interval: 21-32 days Duration of flow: 5-6days Heavy Menses: no Clots: no Intermenstrual Bleeding: no Postcoital Bleeding: no Dysmenorrhea: no (Still breastfeeding)  The patient is sexually active. She currently uses condoms for contraception. She denies dyspareunia.  The patient does perform self breast exams.  There is no notable family history of breast or ovarian cancer in her family.  The patient wears seatbelts: yes.   The patient has regular exercise: not asked.    The patient denies current symptoms of depression.    Review of Systems: Review of Systems  Constitutional:  Negative for chills and fever.  HENT:  Negative for congestion.   Respiratory:  Negative for cough and shortness of breath.   Cardiovascular:  Negative for chest pain and palpitations.  Gastrointestinal:  Negative for abdominal pain, constipation, diarrhea, heartburn, nausea and vomiting.  Genitourinary:  Negative for dysuria, frequency and urgency.  Skin:  Negative for itching and rash.  Neurological:  Negative for dizziness and headaches.  Endo/Heme/Allergies:  Negative for polydipsia.  Psychiatric/Behavioral:  Negative for depression.    Past Medical History:  Patient Active Problem List   Diagnosis Date Noted   BRCA negative 10/12/2019    MyRisk negative 08/12/2019 and remaining lifetime risk 16.5%    Allergy 08/01/2017   Allergic rhinitis 05/10/2015   Asthma, well controlled 05/10/2015   Gastroesophageal reflux disease 05/10/2015    Past Surgical History:  Past Surgical History:  Procedure Laterality Date    ESOPHAGOGASTRODUODENOSCOPY (EGD) WITH PROPOFOL N/A 09/03/2017   Procedure: ESOPHAGOGASTRODUODENOSCOPY (EGD) WITH PROPOFOL;  Surgeon: Lin Landsman, MD;  Location: [place];  Service: Endoscopy;  Laterality: N/A;   HERNIA REPAIR     HYSTEROSCOPY WITH D & C N/A 06/24/2019   Procedure: DILATATION AND CURETTAGE /HYSTEROSCOPY;  Surgeon: Malachy Mood, MD;  Location: [facility];  Service: Gynecology;  Laterality: N/A;   MOLE REMOVAL     TONSILLECTOMY      Gynecologic History:  Patient's last menstrual period was 08/18/2021. Contraception: condoms Last Pap: Results were: 10/27/2021NIL and HR HPV negative   Obstetric History: G1P1001  Family History:  Family History  Problem Relation Age of Onset   GER disease Mother    Irritable bowel syndrome Mother    Hypertension Mother    Pancreatic cancer Maternal Grandmother 67   Ovarian cancer Paternal Grandmother 32   Leukemia Paternal Grandfather    Breast cancer Paternal Aunt 62       Genetic test negative    Social History:  Social History   Socioeconomic History   Marital status: Married    Spouse name: John   Number of children: Not on file   Years of education: Not on file   Highest education level: Not on file  Occupational History   Not on file  Tobacco Use   Smoking status: Never   Smokeless tobacco: Never  Vaping Use   Vaping Use: Never used  Substance and Sexual Activity   Alcohol use: Yes    Comment: occasional   Drug use: No   Sexual activity: Yes    Birth control/protection: Pill, None  Other Topics Concern   Not on file  Social History Narrative   Not on file   Social Determinants of Health   Financial Resource Strain: Not on file  Food Insecurity: Not on file  Transportation Needs: Not on file  Physical Activity: Not on file  Stress: Not on file  Social Connections: Not on file  Intimate Partner Violence: Not on file    Allergies:  Allergies  Allergen Reactions   Other Itching     WALNUTS   Sulfa Antibiotics Hives    Medications: Prior to Admission medications   Medication Sig Start Date End Date Taking? Authorizing Provider  albuterol (VENTOLIN HFA) 108 (90 Base) MCG/ACT inhaler Inhale 1-2 puffs into the lungs every 4 (four) hours as needed for wheezing or shortness of breath. Patient not taking: Reported on 08/23/2020    [provider]  esomeprazole (NEXIUM) 20 MG capsule Take 20 mg by mouth every morning.     [provider]  labetalol (NORMODYNE) 100 MG tablet TAKE 1 TABLET BY MOUTH TWICE A DAY 07/18/20   Malachy Mood, MD  norethindrone (MICRONOR) 0.35 MG tablet Take 1 tablet (0.35 mg total) by mouth daily. Patient not taking: Reported on 08/23/2020 07/16/20   Rod Can, CNM  nystatin cream (MYCOSTATIN) Apply 1 application topically 2 (two) times daily. Patient not taking: Reported on 08/23/2020 06/26/20   Malachy Mood, MD  Prenatal Vit-Fe Fumarate-FA (PRENATAL MULTIVITAMIN) TABS tablet Take 1 tablet by mouth daily at 12 noon. Patient not taking: Reported on 08/23/2020    [provider]    Physical Exam Vitals: Blood pressure 112/76, pulse 84, height $RemoveBe'5\' 3"'cQqjYjLeC$  (1.6 m), weight 166 lb (75.3 kg), last menstrual period 08/18/2021, currently breastfeeding.  General: NAD HEENT: normocephalic, anicteric Thyroid: no enlargement, no palpable nodules Pulmonary: No increased work of breathing, CTAB Cardiovascular: RRR, distal pulses 2+ Breast: Breast symmetrical, no tenderness, no palpable nodules or masses, no skin or nipple retraction present, no nipple discharge.  No axillary or supraclavicular lymphadenopathy. Abdomen: NABS, soft, non-tender, non-distended.  Umbilicus without lesions.  No hepatomegaly, splenomegaly or masses palpable. No evidence of hernia  Genitourinary:  External: Normal external female genitalia.  Normal urethral meatus, normal Bartholin's and Skene's glands.    Vagina: Normal vaginal mucosa, no evidence of  prolapse.    Cervix: Grossly normal in appearance, no bleeding  Uterus: Non-enlarged, mobile, normal contour.  No CMT  Adnexa: ovaries non-enlarged, no adnexal masses  Rectal: deferred  Lymphatic: no evidence of inguinal lymphadenopathy Extremities: no edema, erythema, or tenderness Neurologic: Grossly intact Psychiatric: mood appropriate, affect full  Female chaperone present for pelvic and breast  portions of the physical exam    Assessment: 35 y.o. G1P1001 routine annual exam  Plan: Problem List Items Addressed This Visit   None Visit Diagnoses     Encounter for gynecological examination without abnormal finding    -  Primary   Breast screening           1) STI screening  was notoffered and therefore not obtained  2)  ASCCP guidelines and rational discussed.  Patient opts for every 3 years screening interval  3) Contraception - the patient is currently using  condoms.  She is happy with her current form of contraception and plans to continue  4) Routine healthcare maintenance including cholesterol, diabetes screening discussed managed by PCP  5) Return in about 1 year (around 08/31/2022) for annual.   Malachy Mood, MD, [place], [facility] 08/31/2021,  1:35 PM

## 2021-12-12 IMAGING — US US EXTREM LOW VENOUS*R*
1 series · 13 of 24 positions shown · non-contrast
Comparison: None.

CLINICAL DATA: Right lower extremity pain and swelling. Recent
postpartum

EXAM:
RIGHT LOWER EXTREMITY VENOUS DUPLEX ULTRASOUND
TECHNIQUE: Gray-scale sonography with graded compression, as well as color
Doppler and duplex ultrasound were performed to evaluate the right
lower extremity deep venous system from the level of the common
femoral vein and including the common femoral, femoral, profunda
femoral, popliteal and calf veins including the posterior tibial,
peroneal and gastrocnemius veins when visible. The superficial great
saphenous vein was also interrogated. Spectral Doppler was utilized
to evaluate flow at rest and with distal augmentation maneuvers in
the common femoral, femoral and popliteal veins.

[Series 1: us extrem low venous*right* · 0.08mm/px · 13 of 34 slices shown]
[im 1/34]
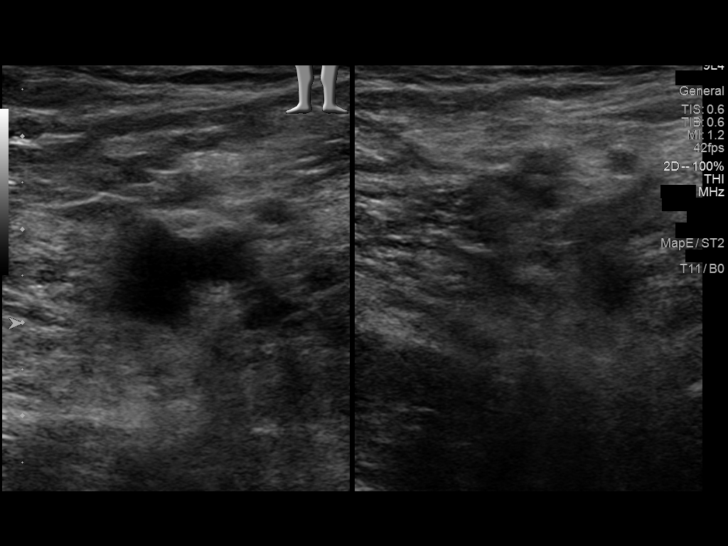
[im 3/34]
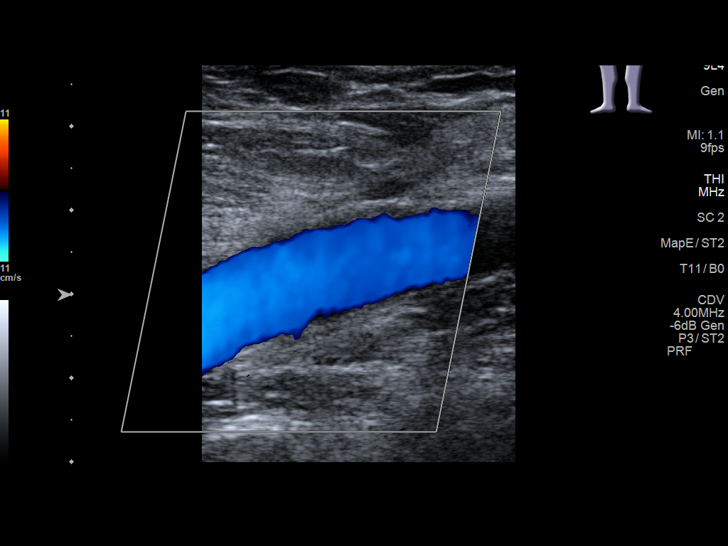
[im 6/34]
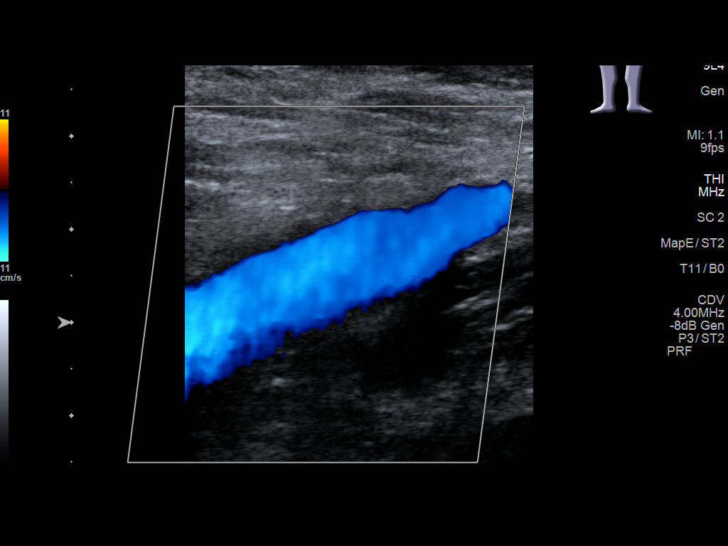
[im 9/34]
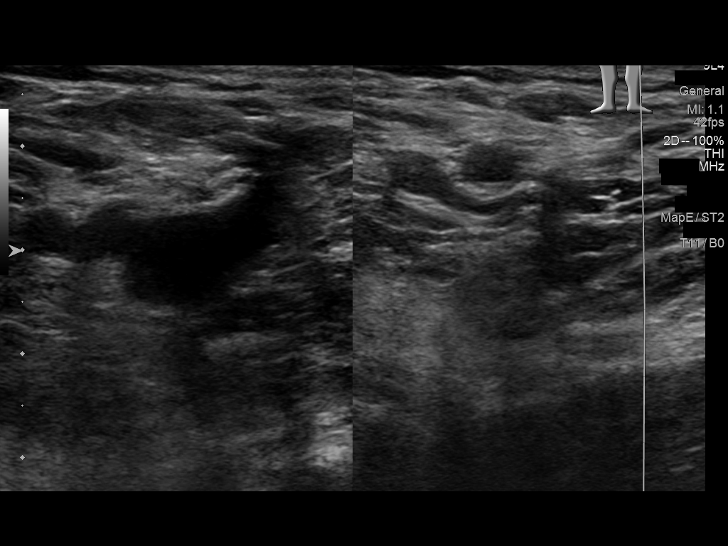
[im 12/34]
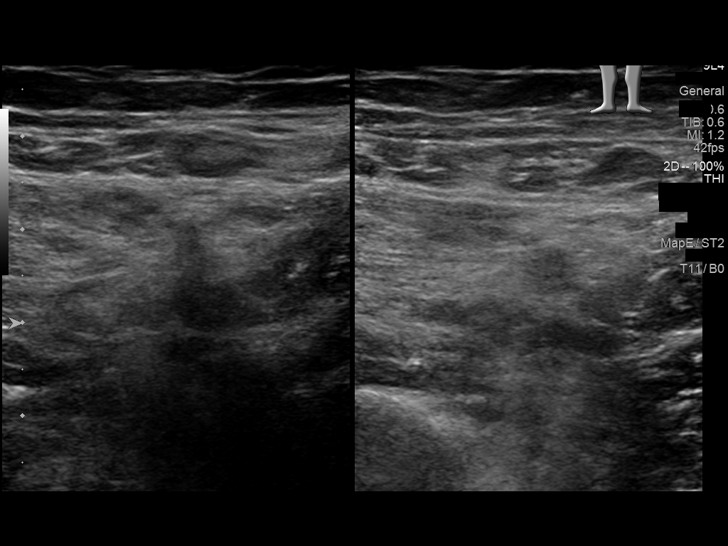
[im 15/34]
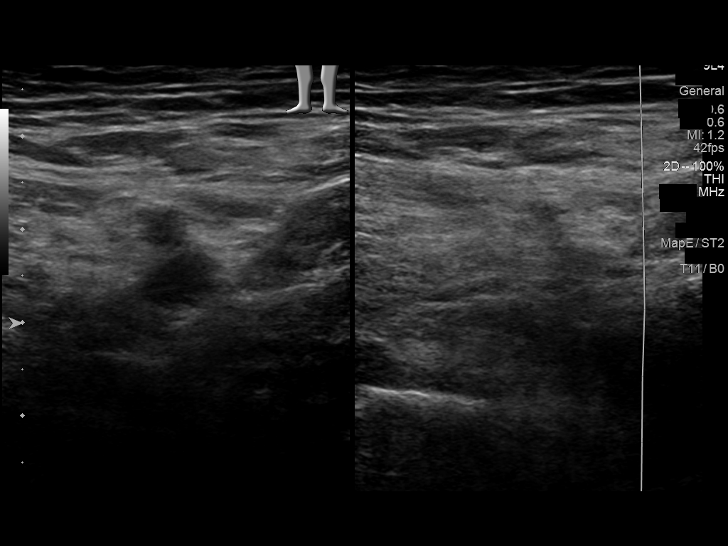
[im 18/34]
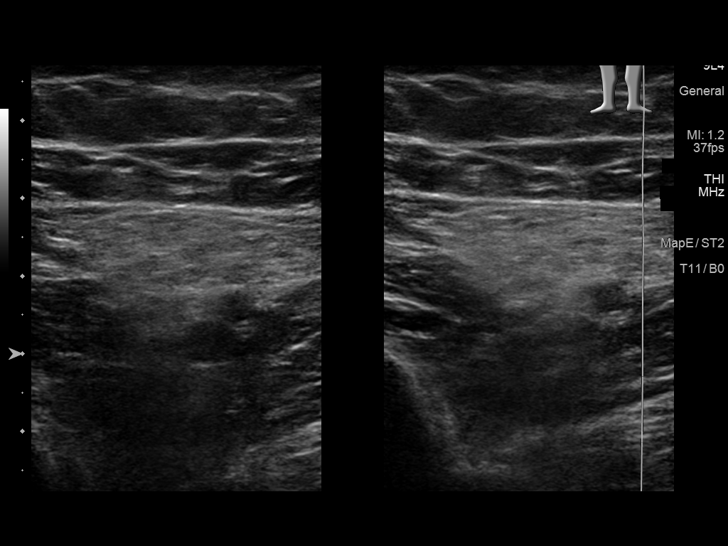
[im 19/34]
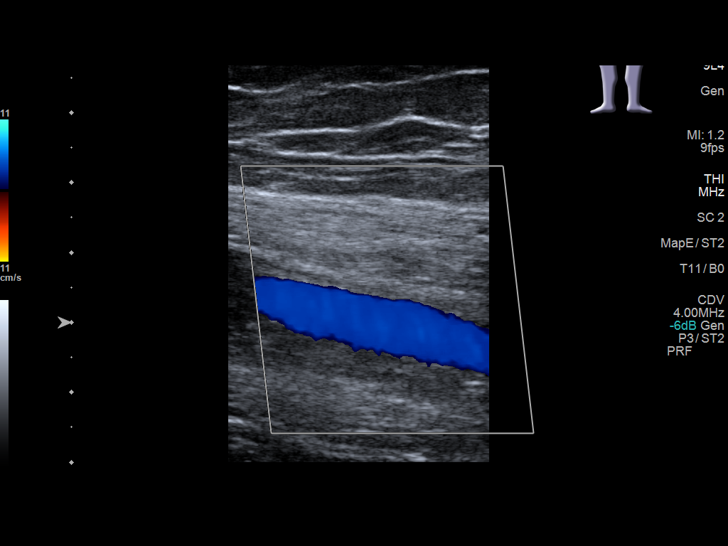
[im 22/34]
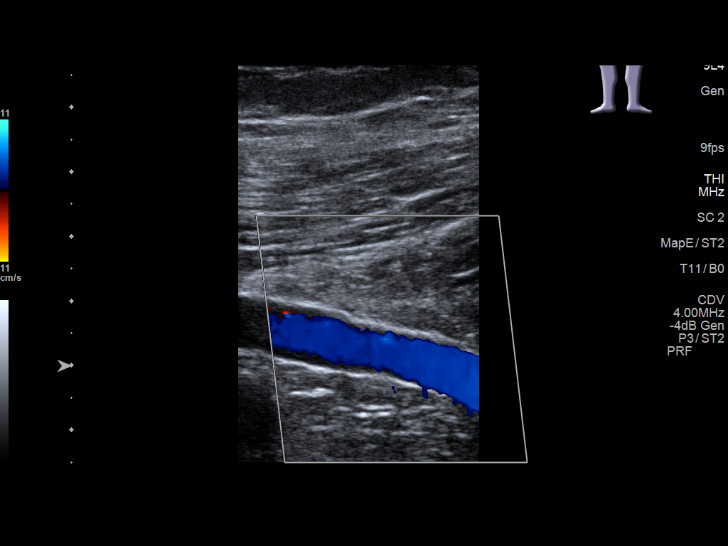
[im 25/34]
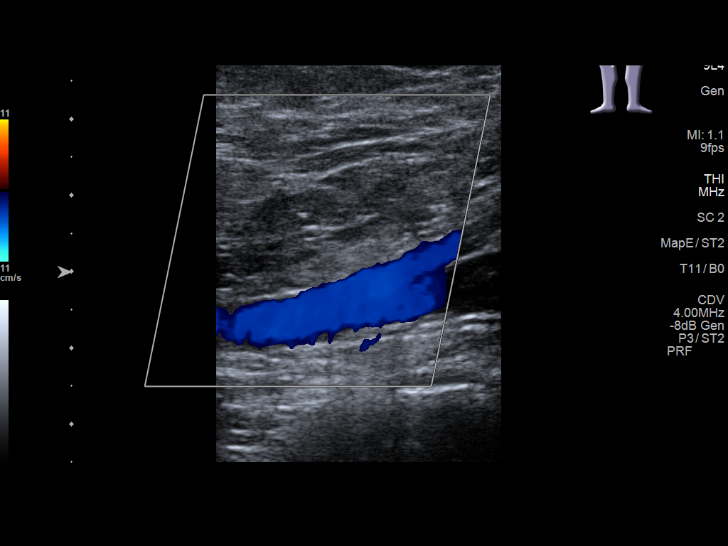
[im 28/34]
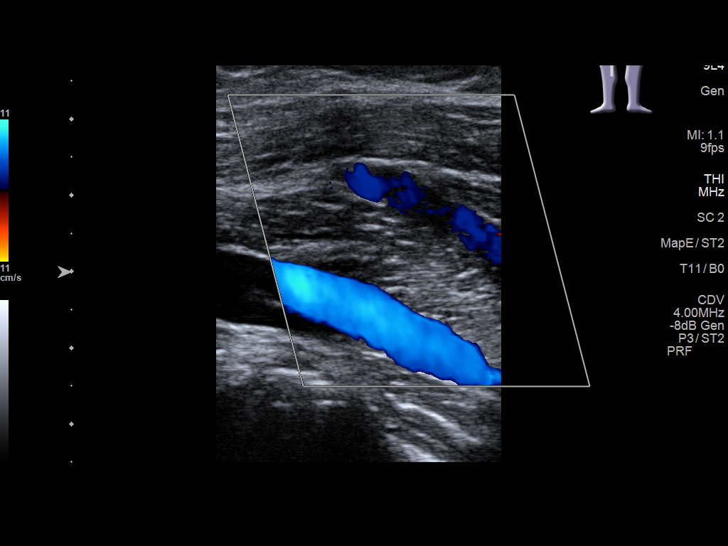
[im 31/34]
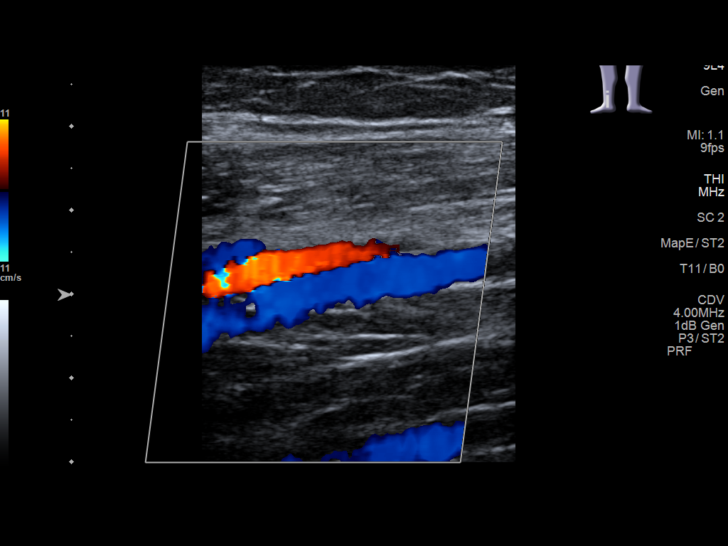
[im 34/34]
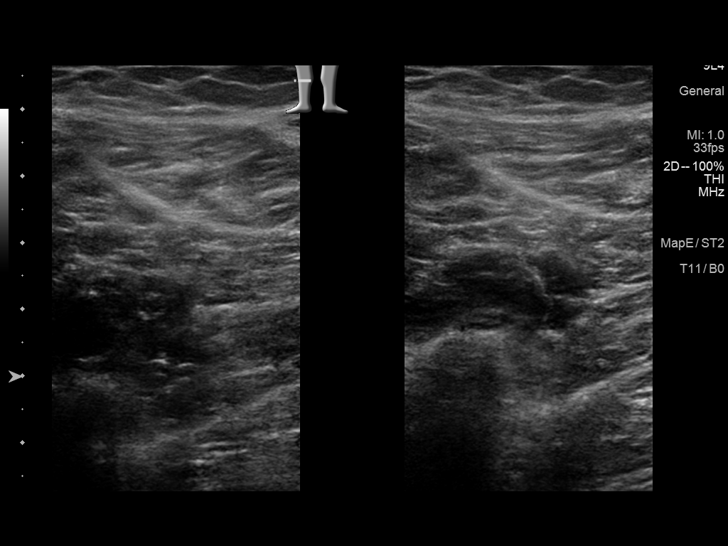

[13 of 24 positions shown; findings below may reference images not displayed]

FINDINGS: Contralateral Common Femoral Vein: Respiratory phasicity is normal
and symmetric with the symptomatic side. No evidence of thrombus.
Normal compressibility.

Common Femoral Vein: No evidence of thrombus. Normal
compressibility, respiratory phasicity and response to augmentation.

Saphenofemoral Junction: No evidence of thrombus. Normal
compressibility and flow on color Doppler imaging.

Profunda Femoral Vein: No evidence of thrombus. Normal
compressibility and flow on color Doppler imaging.

Femoral Vein: No evidence of thrombus. Normal compressibility,
respiratory phasicity and response to augmentation.

Popliteal Vein: No evidence of thrombus. Normal compressibility,
respiratory phasicity and response to augmentation.

Calf Veins: No evidence of thrombus. Normal compressibility and flow
on color Doppler imaging.

Superficial Great Saphenous Vein: No evidence of thrombus. Normal
compressibility.

Venous Reflux:  None.

Other Findings:  None.
IMPRESSION: No evidence of deep venous thrombosis in the right lower extremity.
Left common femoral vein also patent.

## 2024-03-25 ENCOUNTER — Ambulatory Visit: Admitting: Physical Therapy

## 2024-03-29 ENCOUNTER — Encounter: Admitting: Physical Therapy

## 2024-03-31 ENCOUNTER — Encounter: Admitting: Physical Therapy

## 2024-04-06 ENCOUNTER — Encounter: Admitting: Physical Therapy

## 2024-04-12 ENCOUNTER — Encounter: Admitting: Physical Therapy

## 2024-04-14 ENCOUNTER — Encounter: Admitting: Physical Therapy

## 2024-04-19 ENCOUNTER — Encounter: Admitting: Physical Therapy

## 2024-04-21 ENCOUNTER — Encounter: Admitting: Physical Therapy

## 2024-04-27 ENCOUNTER — Encounter: Admitting: Physical Therapy

## 2024-04-29 ENCOUNTER — Encounter: Admitting: Physical Therapy
